# Patient Record
Sex: Male | Born: 1975 | Race: White | Hispanic: No | Smoking: Never smoker
Health system: Southern US, Academic
[De-identification: ages and names within clinical notes are randomized; demographics above are authoritative.]

## PROBLEM LIST (undated history)

## (undated) DIAGNOSIS — I10 Essential (primary) hypertension: Secondary | ICD-10-CM

## (undated) DIAGNOSIS — K222 Esophageal obstruction: Secondary | ICD-10-CM

## (undated) DIAGNOSIS — H52203 Unspecified astigmatism, bilateral: Secondary | ICD-10-CM

## (undated) DIAGNOSIS — K219 Gastro-esophageal reflux disease without esophagitis: Secondary | ICD-10-CM

## (undated) DIAGNOSIS — G43909 Migraine, unspecified, not intractable, without status migrainosus: Secondary | ICD-10-CM

## (undated) DIAGNOSIS — K635 Polyp of colon: Secondary | ICD-10-CM

## (undated) DIAGNOSIS — G4730 Sleep apnea, unspecified: Secondary | ICD-10-CM

## (undated) HISTORY — DX: Gastro-esophageal reflux disease without esophagitis: K21.9

## (undated) HISTORY — DX: Esophageal obstruction: K22.2

## (undated) HISTORY — DX: Essential (primary) hypertension: I10

## (undated) HISTORY — PX: HX GALL BLADDER SURGERY/CHOLE: SHX55

## (undated) HISTORY — DX: Polyp of colon: K63.5

## (undated) HISTORY — DX: Sleep apnea, unspecified: G47.30

## (undated) HISTORY — PX: KNEE SURGERY: SHX244

## (undated) HISTORY — DX: Migraine, unspecified, not intractable, without status migrainosus: G43.909

## (undated) HISTORY — DX: Unspecified astigmatism, bilateral: H52.203

---

## 2018-08-04 LAB — ENTER/EDIT EXTERNAL COMMON LAB RESULTS
CREATININE: 1
POTASSIUM: 3.9

## 2018-08-16 ENCOUNTER — Ambulatory Visit (INDEPENDENT_AMBULATORY_CARE_PROVIDER_SITE_OTHER): Payer: BC Managed Care – PPO | Admitting: Internal Medicine

## 2018-08-16 ENCOUNTER — Other Ambulatory Visit: Payer: Self-pay

## 2018-08-16 ENCOUNTER — Encounter (INDEPENDENT_AMBULATORY_CARE_PROVIDER_SITE_OTHER): Payer: Self-pay | Admitting: Internal Medicine

## 2018-08-16 VITALS — BP 132/86 | HR 82 | Temp 98.0°F | Ht 74.0 in | Wt 366.4 lb

## 2018-08-16 DIAGNOSIS — K219 Gastro-esophageal reflux disease without esophagitis: Secondary | ICD-10-CM

## 2018-08-16 DIAGNOSIS — L989 Disorder of the skin and subcutaneous tissue, unspecified: Principal | ICD-10-CM

## 2018-08-16 DIAGNOSIS — I10 Essential (primary) hypertension: Secondary | ICD-10-CM

## 2018-08-16 DIAGNOSIS — K760 Fatty (change of) liver, not elsewhere classified: Secondary | ICD-10-CM | POA: Insufficient documentation

## 2018-08-16 NOTE — Progress Notes (Signed)
INTERNAL MED, [facility] OUTPATIENT [address]  [place] [address]  [phone]  Name: Seth Valenzuela  MRN: [id number]  Date of Birth: 1975/12/22  Age: 43 y.o.  Date: 08/16/2018    Reason for Visit: Establish Care    History of Present Illness  Patient here today for establishment of care.  Also for evaluation and treatment of hypertension and reflux.  Patient normally resides in a state of [place] but he works here for the [employer].  Is he to the time during the week.  History of hypertension for which he is on Benicar 20 mg daily.  As recent as 2 weeks ago his blood pressure is running a bit high but recently they have been doing better.  Remains on Protonix 40 mg daily with good control of acid reflux.  He does have a few skin lesions which are developing across his forehead for which he like to see Dermatology.  Patient also told that he has a fatty liver in the past.    Patient History  Past Medical History:   Diagnosis Date   . Esophageal reflux    . Hypertension    . Migraine          Past Surgical History:   Procedure Laterality Date   . HX CHOLECYSTECTOMY     . KNEE SURGERY      Right knee         Current Outpatient Medications   Medication Sig   . aspirin (ECOTRIN) 81 mg Oral Tablet, Delayed Release (E.C.) Take 81 mg by mouth Once a day   . olmesartan (BENICAR) 20 mg Oral Tablet Take 20 mg by mouth Once a day    . pantoprazole (PROTONIX) 40 mg Oral Tablet, Delayed Release (E.C.) TAKE 1 TABLET BY MOUTH EVERY DAY     Allergies   Allergen Reactions   . Codeine      Family Medical History:     Problem Relation (Age of Onset)    Alzheimer's/Dementia Paternal Grandmother    Anesthesia Complications Paternal Aunt    Asthma Maternal Grandfather    Blood Clots Mother, Maternal Uncle, Maternal Grandfather    Cancer Mother, Maternal Grandfather    Congestive Heart Failure Father, Paternal Grandfather    Diabetes Father, Paternal Grandfather    Heart Attack Maternal Grandfather, Paternal  Grandfather    High Cholesterol Maternal Grandmother    Hypertension (High Blood Pressure) Father, Paternal Grandfather    Migraines Mother    Parkinsons Disease Maternal Uncle    Sleep disorders Maternal Aunt    Stroke Father, Paternal Grandmother    Sudden Death no cause Maternal Grandfather            Social History     Socioeconomic History   . Marital status: Divorced     Spouse name: Not on file   . Number of children: Not on file   . Years of education: Not on file   . Highest education level: Not on file   Occupational History   . Not on file   Social Needs   . Financial resource strain: Not on file   . Food insecurity     Worry: Not on file     Inability: Not on file   . Transportation needs     Medical: Not on file     Non-medical: Not on file   Tobacco Use   . Smoking status: Never Smoker   . Smokeless tobacco: Never  Used   Substance and Sexual Activity   . Alcohol use: Never     Frequency: Never   . Drug use: Never   . Sexual activity: Not on file   Lifestyle   . Physical activity     Days per week: Not on file     Minutes per session: Not on file   . Stress: Not on file   Relationships   . Social Product manager on phone: Not on file     Gets together: Not on file     Attends religious service: Not on file     Active member of club or organization: Not on file     Attends meetings of clubs or organizations: Not on file     Relationship status: Not on file   . Intimate partner violence     Fear of current or ex partner: Not on file     Emotionally abused: Not on file     Physically abused: Not on file     Forced sexual activity: Not on file   Other Topics Concern   . Not on file   Social History Narrative   . Not on file       Review of Systems  BP 132/86 (Site: Left, Patient Position: Sitting, Cuff Size: Adult Large)   Pulse 82   Temp 36.7 C (98 F)   Ht 1.88 m (6\' 2" )   Wt (!) 166 kg (366 lb 6.5 oz)   SpO2 95%   BMI 47.04 kg/m       Constitutional: No fevers, chills, malaise or abnormal  weight loss.    HENT: No tinnitus, vertigo, hearing loss, ear pain, sinus drainage, nasal congestion or throat pain.  Eyes: No vision changes, diplopia, drainage, pain  Respiratory: No cough, shortness of breath, wheezing  Cardiovascular: No chest pain, PND, orthopnea, palpitations, lower extremity edema.    Gastrointestinal: No abdominal pain, nausea, vomiting, diarrhea, constipation, melana or hematochezia   Endocrine: No polyuria, polydipsia, heat or cold intolerance.  Genitourinary: No dysuria, urgency, frequency, hematuria, nocturia.  Musculoskeletal: No myalgias, arthralgias.  Skin: No rashes, suspicious lesions. + forehead lesions  Neurological: No headaches, weakness, paresthesias  Hematological:  No bleeding, spontaneous bruising.  Psychiatric/Behavioral: No confusion, agitation, hallucinations, paranoia, delusions.    All other systems reviewed and are negative.    Physical Examination:  Vitals reviewed.  Constitutional: He is oriented to person, place, and time.  He appears well-developed and well-nourished.   HEENT:  Normocephalic, atraumatic.  TM's clear and intact bilaterally.  Canals clear.  Nose clear with drainage.  Normal appearing nasal mucosa.  Posterior pharynx clear without lesions or exudate. Mucous membranes moist without lesions.   Neck: Supple. Thyroid normal without enlargement or palpable nodules.  Trachea midline.  Cardiovascular: Normal rate and rhythm without murmurs, rubs or gallops.  No JVD.  No bruits.  Pulmonary/Chest: Lungs clear without wheezes, rales or rhonchi.  No chest wall tenderness or deformity.   Abdominal: Soft.  Obese, nontender, nondistended with normal bowel sounds in all quadrants.  No guarding, rebound , abnormal masses.  Rectal exam deferred.  Musculoskeletal: Full and painless ROM in all joint groups.  Neurological: Alert and oriented x 4.  Cranial nerves 2-12 intact without defecits.  DTR's 2+ BUE's/BLE's.  Strength and sensation intact in all extremeties.      Skin: Skin is warm and dry. No rash or suspicious skin lesions noted.  Several white papular lesions present  across his forehead.  Psychiatric:  Normal mood and affect. Speech is normal and behavior is normal. Judgment and thought content normal. Cognition and memory are normal.       Assessment and Plan    (L98.9) Skin lesions  (primary encounter diagnosis)  Plan: Refer to [facility]        Referral made to [facility] dermatology for evaluation.    (I10) Essential hypertension  Plan:  Blood pressure well controlled today.  Continue Benicar 20 mg daily.  Continue to monitor at home periodically.  Follow-up of his return in 6 months.    (K21.9) Gastroesophageal reflux disease without esophagitis  Plan:  Symptoms well controlled on Protonix 40 mg daily.  Continue along with dietary modification.    (K76.0) Fatty liver  Plan:  Continue to control risk factors.  Abstain from alcohol.  Continue advanced diet in hopes of promoting further weight loss.  Patient reports that he has lost 30-40 lb thus far.        Orders Placed This Encounter   . Refer to [facility]         Return in about 6 months (around 02/15/2019).      The patient has been educated and verbalized understanding regarding the services provided during this visit.    Donna Bernard, MD

## 2018-08-20 ENCOUNTER — Other Ambulatory Visit (INDEPENDENT_AMBULATORY_CARE_PROVIDER_SITE_OTHER): Payer: Self-pay | Admitting: Internal Medicine

## 2018-09-05 ENCOUNTER — Ambulatory Visit
Payer: BC Managed Care – PPO | Attending: DERMATOLOGY | Admitting: Student in an Organized Health Care Education/Training Program

## 2018-09-05 ENCOUNTER — Other Ambulatory Visit: Payer: Self-pay

## 2018-09-05 VITALS — Temp 97.3°F | Ht 74.0 in | Wt 358.0 lb

## 2018-09-05 DIAGNOSIS — L738 Other specified follicular disorders: Secondary | ICD-10-CM | POA: Insufficient documentation

## 2018-09-05 DIAGNOSIS — L700 Acne vulgaris: Secondary | ICD-10-CM | POA: Insufficient documentation

## 2018-09-05 MED ORDER — TRETINOIN 0.025 % TOPICAL CREAM
TOPICAL_CREAM | Freq: Every evening | CUTANEOUS | 5 refills | Status: AC
Start: 2018-09-05 — End: ?

## 2018-09-05 NOTE — Progress Notes (Addendum)
Dermatology, [facility]  [place] [address]  [phone]    Date:   09/05/2018  Name: Seth Valenzuela  Age: 43 y.o.    Chief complaint: Skin Check    HPI  Seth Valenzuela is a 43 y.o. male presenting for skin check.  Patient was seen by outside dermatologist ~4 years ago who epilated small yellowish lesions on his face. They are do not hurt, itch, bleed, or ulcerate but they are very bothersome to patient and he would ike to removed.  Additionally, he is curious about treatment options for the small black lesions on his nose.  He otherwise denies any new, changing, bleeding, or rapidly growing lesions.  No other skin-related complaints.  No personal history of skin cancer.   Family history of precancerous lesions in grandfather.     Review of Systems   Constitutional: Negative for chills and fever.   Skin: Negative for itching and rash.     Current Medications  . aspirin (ECOTRIN) 81 mg Oral Tablet, Delayed Release (E.C.) Take 81 mg by mouth Once a day   . olmesartan (BENICAR) 20 mg Oral Tablet Take 20 mg by mouth Once a day    . pantoprazole (PROTONIX) 40 mg Oral Tablet, Delayed Release (E.C.) TAKE 1 TABLET BY MOUTH EVERY DAY     Allergies   Allergen Reactions   . Codeine      Past Medical History:   Diagnosis Date   . Esophageal reflux    . Hypertension    . Migraine      Physical Exam  Vitals: Temperature 36.3 C (97.3 F), temperature source Thermal Scan, height 1.88 m (6\' 2" ), weight (!) 162 kg (358 lb 0.4 oz).  Physical Exam  Constitutional:       Appearance: Normal appearance.   HENT:      Head:     Skin:     General: Skin is warm and dry.   Neurological:      Mental Status: He is alert and oriented to person, place, and time.     General skin exam was performed including head, neck, anterior/posterior trunk, bilateral upper, and lower extremities and revealed no areas of concern other than those documented.     Assessment and Plan  Problem List Items  Addressed This Visit     None      Visit Diagnoses     Skin lesions            1. 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 Epilated with Epilating needle. No bleeding or complications. Patient tolerated well. Advised to return to clinic with any future problems.   - Patient charged $75 for cosmetic procedure. No other bill for this service should be generated.     2. Acne vulgaris, comedonal (#2 on head) -  - Start tretinoin 0.025% cream nightly, prescription given.  Reviewed risk of dryness, irritation.      The patient was educated on the importance of avoiding excessive sun exposure and wearing sunscreen daily.  Advised patient to re-apply sunscreen every 2-3 hours.  Advised the patient to avoid going to the tanning beds.  Advised to check skin routinely for any changes, especially any new moles or changes in existing moles.    Follow up as needed     I am scribing for, and in the presence of, Dr. Arnoldo Morale for services provided on 09/05/2018.  [employer], SCRIBE  I personally performed the services described in this documentation, as scribed  in my presence, and it is both accurate  and complete.    Rosetta Posner, MD  09/05/2018, 13:57  See resident's note for details. I saw and examined the patient and agree with the resident's findings and plan as written except as noted, and I was present and supervised/observed all procedures in their entirety.    Mauri Reading, MD 09/05/2018, 14:32

## 2018-12-12 ENCOUNTER — Inpatient Hospital Stay (HOSPITAL_COMMUNITY)
Admission: EM | Admit: 2018-12-12 | Discharge: 2018-12-12 | Disposition: A | Discharge status: Home / Self Care | Payer: BC Managed Care – PPO | Source: Other Acute Inpatient Hospital

## 2018-12-12 ENCOUNTER — Observation Stay (HOSPITAL_COMMUNITY): Payer: Self-pay | Admitting: Internal Medicine

## 2018-12-12 DIAGNOSIS — I249 Acute ischemic heart disease, unspecified: Secondary | ICD-10-CM

## 2018-12-12 DIAGNOSIS — R0789 Other chest pain: Secondary | ICD-10-CM

## 2018-12-12 DIAGNOSIS — I10 Essential (primary) hypertension: Secondary | ICD-10-CM

## 2018-12-12 LAB — ENTER/EDIT EXTERNAL COMMON LAB RESULTS
CHOLESTEROL: 193
CREATININE: 0.67
HDL-CHOLESTEROL: 38
HEMOGLOBIN A1C: 6.5
LDL (CALCULATED): 113
POTASSIUM: 4.1
TRIGLYCERIDES: 208

## 2018-12-13 ENCOUNTER — Ambulatory Visit (INDEPENDENT_AMBULATORY_CARE_PROVIDER_SITE_OTHER): Payer: BC Managed Care – PPO | Admitting: Internal Medicine

## 2018-12-13 ENCOUNTER — Other Ambulatory Visit: Payer: Self-pay

## 2018-12-13 ENCOUNTER — Encounter (INDEPENDENT_AMBULATORY_CARE_PROVIDER_SITE_OTHER): Payer: Self-pay | Admitting: Internal Medicine

## 2018-12-13 VITALS — BP 136/86 | HR 98 | Ht 74.0 in | Wt 349.9 lb

## 2018-12-13 DIAGNOSIS — K219 Gastro-esophageal reflux disease without esophagitis: Secondary | ICD-10-CM

## 2018-12-13 DIAGNOSIS — K760 Fatty (change of) liver, not elsewhere classified: Secondary | ICD-10-CM

## 2018-12-13 DIAGNOSIS — R0789 Other chest pain: Secondary | ICD-10-CM

## 2018-12-13 DIAGNOSIS — I10 Essential (primary) hypertension: Secondary | ICD-10-CM

## 2018-12-13 DIAGNOSIS — I2510 Atherosclerotic heart disease of native coronary artery without angina pectoris: Secondary | ICD-10-CM

## 2018-12-13 HISTORY — DX: Atherosclerotic heart disease of native coronary artery without angina pectoris: I25.10

## 2018-12-13 NOTE — Progress Notes (Signed)
INTERNAL MED, [facility] OUTPATIENT [address]  [place] [address]  [phone]  Name: Seth Valenzuela  MRN: [id number]  Date of Birth: [birth year]  Age: 43 y.o.  Date: 12/13/2018    Reason for Visit: Hospital Follow Up    History of Present Illness  Patient here today for follow-up of hypertension, GERD, fatty liver.  Patient also for hospital follow-up.  He was hospitalized overnight at [facility] with substernal chest pain radiating into the left arm.  This occurred yesterday on his way to work.  Was having some heaviness in his chest.  Radiated into the left arm.  His blood pressure was also markedly elevated at 185/115.  Patient was given a single dose of sublingual nitroglycerin in his pain went completely away.  Cardiac enzymes were negative.  Had a CT scan performed on his chest which was negative for PE or dissection.  He did not undergo ischemic evaluation secondary to having a cardiac catheterization performed 10/25/2017 which at that time just showed some luminal irregularities.  Patient does have reflux and has been having some breakthrough symptoms.  He does not take Protonix on a regular basis secondary to a causing constipation.  He also reports that he has not been taking his Benicar every day secondary to if precipitating hypotension in the past.  Was told that his A1c was 6.5 today.  Patient does have occasional dysphagia.  Has never had endoscopy.  He somewhat frustrated and this is the 3rd episode of chest pain he has had like this since last August    Patient History  Past Medical History:   Diagnosis Date   . Coronary artery disease involving native coronary artery of native heart 12/13/2018   . Esophageal reflux    . Hypertension    . Migraine          Past Surgical History:   Procedure Laterality Date   . HX CHOLECYSTECTOMY     . KNEE SURGERY      Right knee         Current Outpatient Medications   Medication Sig   . aspirin (ECOTRIN) 81 mg Oral Tablet, Delayed Release  (E.C.) Take 81 mg by mouth Once a day   . olmesartan (BENICAR) 20 mg Oral Tablet Take 20 mg by mouth Once a day    . pantoprazole (PROTONIX) 40 mg Oral Tablet, Delayed Release (E.C.) TAKE 1 TABLET BY MOUTH EVERY DAY   . Tretinoin (RETIN-A) 0.025 % Cream Apply topically Every night     Allergies   Allergen Reactions   . Codeine      Family Medical History:     Problem Relation (Age of Onset)    Alzheimer's/Dementia Paternal Grandmother    Anesthesia Complications Paternal Aunt    Asthma Maternal Grandfather    Blood Clots Mother, Maternal Uncle, Maternal Grandfather    Cancer Mother, Maternal Grandfather    Congestive Heart Failure Father, Paternal Grandfather    Diabetes Father, Paternal Grandfather    Heart Attack Maternal Grandfather, Paternal Grandfather    High Cholesterol Maternal Grandmother    Hypertension (High Blood Pressure) Father, Paternal Grandfather    Migraines Mother    Parkinsons Disease Maternal Uncle    Sleep disorders Maternal Aunt    Stroke Father, Paternal Grandmother    Sudden Death no cause Maternal Grandfather            Social History     Socioeconomic History   . Marital status: Divorced  Spouse name: Not on file   . Number of children: Not on file   . Years of education: Not on file   . Highest education level: Not on file   Occupational History   . Not on file   Social Needs   . Financial resource strain: Not on file   . Food insecurity     Worry: Not on file     Inability: Not on file   . Transportation needs     Medical: Not on file     Non-medical: Not on file   Tobacco Use   . Smoking status: Never Smoker   . Smokeless tobacco: Never Used   Substance and Sexual Activity   . Alcohol use: Never     Frequency: Never   . Drug use: Never   . Sexual activity: Not on file   Lifestyle   . Physical activity     Days per week: Not on file     Minutes per session: Not on file   . Stress: Not on file   Relationships   . Social Product manager on phone: Not on file     Gets together: Not  on file     Attends religious service: Not on file     Active member of club or organization: Not on file     Attends meetings of clubs or organizations: Not on file     Relationship status: Not on file   . Intimate partner violence     Fear of current or ex partner: Not on file     Emotionally abused: Not on file     Physically abused: Not on file     Forced sexual activity: Not on file   Other Topics Concern   . Not on file   Social History Narrative   . Not on file       Review of Systems  BP 136/86   Pulse 98   Ht 1.88 m (6\' 2" )   Wt (!) 159 kg (349 lb 13.9 oz)   SpO2 97%   BMI 44.92 kg/m       Constitutional: No fevers, chills, malaise or abnormal weight loss.    HENT: No tinnitus, vertigo, hearing loss, ear pain, sinus drainage, nasal congestion or throat pain.  Eyes: No vision changes, diplopia, drainage, pain  Respiratory: No cough, shortness of breath, wheezing  Cardiovascular: + chest pain, no PND, orthopnea, palpitations, lower extremity edema.    Gastrointestinal: No abdominal pain, nausea, vomiting, diarrhea, constipation, melana or hematochezia + dyspepsia + dysphagia  Endocrine: No polyuria, polydipsia, heat or cold intolerance.  Genitourinary: No dysuria, urgency, frequency, hematuria, nocturia.  Musculoskeletal: No myalgias, arthralgias.  Skin: No rashes, suspicious lesions.  Neurological: No headaches, weakness, paresthesias  Hematological:  No bleeding, spontaneous bruising.  Psychiatric/Behavioral: No confusion, agitation, hallucinations, paranoia, delusions.    All other systems reviewed and are negative.    Physical Examination:  Vitals reviewed.  Constitutional: He is oriented to person, place, and time.  He appears well-developed and well-nourished.   HEENT:  Normocephalic, atraumatic.  TM's clear and intact bilaterally.  Canals clear.  Nose clear with drainage.  Normal appearing nasal mucosa.  Posterior pharynx clear without lesions or exudate. Mucous membranes moist without lesions.      Neck: Supple. Thyroid normal without enlargement or palpable nodules.  Trachea midline.  Cardiovascular: Normal rate and rhythm without murmurs, rubs or gallops.  No JVD.  No bruits.  Pulmonary/Chest: Lungs clear without wheezes, rales or rhonchi.  No chest wall tenderness or deformity.   Abdominal: Soft. Nontender, nondistended with normal bowel sounds in all quadrants.  No guarding, rebound , abnormal masses.  Rectal exam deferred.  Musculoskeletal: Full and painless ROM in all joint groups.  Neurological: Alert and oriented x 4.  Cranial nerves 2-12 intact without defecits.  DTR's 2+ BUE's/BLE's.  Strength and sensation intact in all extremeties.   Skin: Skin is warm and dry. No rash or suspicious skin lesions noted.  Psychiatric:  Normal mood and affect. Speech is normal and behavior is normal. Judgment and thought content normal. Cognition and memory are normal.       Assessment and Plan    (I10) Essential hypertension  (primary encounter diagnosis)  Plan:  Blood pressure stable today.  Encouraged him to try taking his Benicar on a daily basis once again.  Check his morning blood pressure and recommended he take his med as long as his systolic pressure is [id number] or greater.    (K21.9) Gastroesophageal reflux disease without esophagitis  Plan: ENDOSCOPY REQUEST (RUBY ONLY)        Recommend regular use of Protonix.  Increase fluid intake as well as fruit and fiber to help promote regular bowel movement.  Consider fiber supplement if needed.  Referral was also made for endoscopy secondary to recurrent noncardiac chest pain and intermittent dysphagia.    (K76.0) Fatty liver  Plan:  Continue risk factor reduction.  Follow low-fat/low-cholesterol diet.  Follow liver enzymes.    (R07.89) Other chest pain  Plan: ENDOSCOPY REQUEST (RUBY ONLY)        Extremely unlikely to be cardiac related given his reassuring cardiac catheterization 10/25/2017.  Sign for records from hospital stay yesterday.  Follow with regular use of  Protonix.  Referred for upper endoscopy.    (I25.10) Coronary artery disease involving native coronary artery of native heart without angina pectoris  Plan:  Minimal coronary disease on cardiac catheterization.  Treat risk factors aggressively.  Will discuss statin therapy at his return visit in 1 month.        Orders Placed This Encounter   . ENDOSCOPY REQUEST (RUBY ONLY)       Return in about 1 month (around 01/13/2019).      The patient has been educated and verbalized understanding regarding the services provided during this visit.    Donna Bernard, MD

## 2018-12-27 ENCOUNTER — Other Ambulatory Visit (INDEPENDENT_AMBULATORY_CARE_PROVIDER_SITE_OTHER): Payer: Self-pay | Admitting: Internal Medicine

## 2019-01-11 ENCOUNTER — Other Ambulatory Visit: Payer: Self-pay

## 2019-01-11 ENCOUNTER — Encounter (INDEPENDENT_AMBULATORY_CARE_PROVIDER_SITE_OTHER): Payer: Self-pay | Admitting: Internal Medicine

## 2019-01-11 ENCOUNTER — Ambulatory Visit (INDEPENDENT_AMBULATORY_CARE_PROVIDER_SITE_OTHER): Payer: BC Managed Care – PPO | Admitting: Internal Medicine

## 2019-01-11 VITALS — BP 150/90 | HR 78 | Temp 97.5°F | Ht 74.0 in | Wt 366.6 lb

## 2019-01-11 DIAGNOSIS — I83812 Varicose veins of left lower extremities with pain: Secondary | ICD-10-CM

## 2019-01-11 DIAGNOSIS — Z23 Encounter for immunization: Secondary | ICD-10-CM

## 2019-01-11 DIAGNOSIS — K219 Gastro-esophageal reflux disease without esophagitis: Secondary | ICD-10-CM

## 2019-01-11 DIAGNOSIS — I2510 Atherosclerotic heart disease of native coronary artery without angina pectoris: Secondary | ICD-10-CM

## 2019-01-11 DIAGNOSIS — I10 Essential (primary) hypertension: Secondary | ICD-10-CM

## 2019-01-11 DIAGNOSIS — E7800 Pure hypercholesterolemia, unspecified: Secondary | ICD-10-CM

## 2019-01-11 HISTORY — DX: Pure hypercholesterolemia, unspecified: E78.00

## 2019-01-11 MED ORDER — OLMESARTAN 20 MG TABLET
20.00 mg | ORAL_TABLET | Freq: Two times a day (BID) | ORAL | 3 refills | Status: DC
Start: 2019-01-11 — End: 2019-01-14

## 2019-01-11 NOTE — Nursing Note (Signed)
01/11/19 0800   Depression Screen   Little interest or pleasure in doing things. 0   Feeling down, depressed, or hopeless 0   PHQ 2 Total 0

## 2019-01-11 NOTE — Progress Notes (Signed)
INTERNAL MED, [facility] OUTPATIENT [place]  [place] [address]  [phone]  Name: Seth Valenzuela  MRN: [id number]  Date of Birth: 1975-08-05  Age: 43 y.o.  Date: 01/11/2019    Reason for Visit: Follow-up    History of Present Illness  Patient for one-month follow-up of hypertension, CAD, GERD, hypercholesterolemia, varicose veins lower extremities.  Patient presents today complaining about a 3 day history of pain located in the posterior aspect of his left calf.  Patient has significant varicosities in the left lower extremity when compared to the right.  He does wear knee-high support stockings during the day.  There has been no swelling or redness the left lower extremity.  No chest pain or shortness of breath.  His chest pain is doing better with regular use of Protonix.  Requesting flu vaccine.  He believes his blood pressure is doing worse on amlodipine rather than better.  He remains on Benicar 20 mg once daily along with amlodipine 5 mg daily.  Home blood pressure readings have been in the [id number] range systolic.    Patient History  Past Medical History:   Diagnosis Date   . Coronary artery disease involving native coronary artery of native heart 12/13/2018   . Esophageal reflux    . Hypercholesteremia 01/11/2019   . Hypertension    . Migraine          Past Surgical History:   Procedure Laterality Date   . HX CHOLECYSTECTOMY     . KNEE SURGERY      Right knee         Current Outpatient Medications   Medication Sig   . aspirin (ECOTRIN) 81 mg Oral Tablet, Delayed Release (E.C.) Take 81 mg by mouth Once a day   . olmesartan (BENICAR) 20 mg Oral Tablet Take 1 Tab (20 mg total) by mouth Twice daily   . pantoprazole (PROTONIX) 40 mg Oral Tablet, Delayed Release (E.C.) TAKE 1 TABLET BY MOUTH EVERY DAY   . Tretinoin (RETIN-A) 0.025 % Cream Apply topically Every night     Allergies   Allergen Reactions   . Codeine      Family Medical History:     Problem Relation (Age of Onset)     Alzheimer's/Dementia Paternal Grandmother    Anesthesia Complications Paternal Aunt    Asthma Maternal Grandfather    Blood Clots Mother, Maternal Uncle, Maternal Grandfather    Cancer Mother, Maternal Grandfather    Congestive Heart Failure Father, Paternal Grandfather    Diabetes Father, Paternal Grandfather    Heart Attack Maternal Grandfather, Paternal Grandfather    High Cholesterol Maternal Grandmother    Hypertension (High Blood Pressure) Father, Paternal Grandfather    Migraines Mother    Parkinsons Disease Maternal Uncle    Sleep disorders Maternal Aunt    Stroke Father, Paternal Grandmother    Sudden Death no cause Maternal Grandfather            Social History     Socioeconomic History   . Marital status: Divorced     Spouse name: Not on file   . Number of children: Not on file   . Years of education: Not on file   . Highest education level: Not on file   Occupational History   . Not on file   Social Needs   . Financial resource strain: Not on file   . Food insecurity     Worry: Not on file     Inability:  Not on file   . Transportation needs     Medical: Not on file     Non-medical: Not on file   Tobacco Use   . Smoking status: Never Smoker   . Smokeless tobacco: Never Used   Substance and Sexual Activity   . Alcohol use: Never     Frequency: Never   . Drug use: Never   . Sexual activity: Not on file   Lifestyle   . Physical activity     Days per week: Not on file     Minutes per session: Not on file   . Stress: Not on file   Relationships   . Social Product manager on phone: Not on file     Gets together: Not on file     Attends religious service: Not on file     Active member of club or organization: Not on file     Attends meetings of clubs or organizations: Not on file     Relationship status: Not on file   . Intimate partner violence     Fear of current or ex partner: Not on file     Emotionally abused: Not on file     Physically abused: Not on file     Forced sexual activity: Not on file      Other Topics Concern   . Not on file   Social History Narrative   . Not on file       Review of Systems  BP (!) 150/90 (Site: Left, Patient Position: Sitting, Cuff Size: Adult Large)   Pulse 78   Temp 36.4 C (97.5 F)   Ht 1.88 m (6\' 2" )   Wt (!) 166 kg (366 lb 10 oz)   SpO2 99%   BMI 47.07 kg/m       Constitutional: No fevers, chills, malaise or abnormal weight loss.    HENT: No tinnitus, vertigo, hearing loss, ear pain, sinus drainage, nasal congestion or throat pain.  Eyes: No vision changes, diplopia, drainage, pain  Respiratory: No cough, shortness of breath, wheezing  Cardiovascular: No chest pain, PND, orthopnea, palpitations, lower extremity edema.    Gastrointestinal: No abdominal pain, nausea, vomiting, diarrhea, constipation, melana or hematochezia   Endocrine: No polyuria, polydipsia, heat or cold intolerance.  Genitourinary: No dysuria, urgency, frequency, hematuria, nocturia.  Musculoskeletal: + myalgias, no arthralgias.  Skin: No rashes, suspicious lesions.  Neurological: No headaches, weakness, paresthesias  Hematological:  No bleeding, spontaneous bruising.  Psychiatric/Behavioral: No confusion, agitation, hallucinations, paranoia, delusions.    All other systems reviewed and are negative.    Physical Examination:  Vitals reviewed.  Constitutional: He is oriented to person, place, and time.  He appears well-developed and well-nourished.   HEENT:  Normocephalic, atraumatic.  TM's clear and intact bilaterally.  Canals clear.  Nose clear with drainage.  Normal appearing nasal mucosa.  Posterior pharynx clear without lesions or exudate. Mucous membranes moist without lesions.   Neck: Supple. Thyroid normal without enlargement or palpable nodules.  Trachea midline.  Cardiovascular: Normal rate and rhythm without murmurs, rubs or gallops.  No JVD.  No bruits.  Significant ropey varicosities present over the left lower extremity.  Mild tenderness over the posterior calf with slight firmness of a  superficial varicosity.  No redness or warmth.  Pulmonary/Chest: Lungs clear without wheezes, rales or rhonchi.  No chest wall tenderness or deformity.   Abdominal: Soft. Nontender, nondistended with normal bowel sounds in all quadrants.  No guarding, rebound , abnormal masses.  Rectal exam deferred.  Musculoskeletal: Full and painless ROM in all joint groups.  Neurological: Alert and oriented x 4.  Cranial nerves 2-12 intact without defecits.  DTR's 2+ BUE's/BLE's.  Strength and sensation intact in all extremeties.   Skin: Skin is warm and dry. No rash or suspicious skin lesions noted.  Psychiatric:  Normal mood and affect. Speech is normal and behavior is normal. Judgment and thought content normal. Cognition and memory are normal.       Assessment and Plan    (I10) Essential hypertension  (primary encounter diagnosis)  Plan:  Systolic pressure remains elevated.  Discontinue amlodipine.  Increase Benicar to 20 mg b.i.d..  Continue to monitor home blood pressures.    [id number]) Varicose veins of left lower extremity with pain  Plan:  Amlodipine discontinued secondary to edema and significant venous insufficiency.  Continue knee-high support stockings.  Recommend moist heat to left posterior calf twice a day.    (I25.10) Coronary artery disease involving native coronary artery of native heart without angina pectoris  Plan:  Stable and asymptomatic.  Continue aspirin 81 mg daily.    (K21.9) Gastroesophageal reflux disease without esophagitis  Plan:  Symptoms improved with regular use of Protonix.  Continue 40 mg every other day.  Continue dietary modification.    (E78.00) Hypercholesteremia  Plan:  Last lipid profile showed an LDL of 113. Goal should be less than 100 given his mild coronary disease.  Recommend statin therapy.  He would like to hold off and recheck lipids in 6 months.    (Z23) Need for influenza vaccination  Plan: Influenza Vaccine IM Age 37mo through Adult         0.5ML(admin)        Flu vaccine  today.        Orders Placed This Encounter   . Influenza Vaccine IM Age 51mo through Adult 0.5ML(admin)   . olmesartan (BENICAR) 20 mg Oral Tablet                 Return in about 3 months (around 04/13/2019).      The patient has been educated and verbalized understanding regarding the services provided during this visit.    Donna Bernard, MD

## 2019-01-14 ENCOUNTER — Telehealth (INDEPENDENT_AMBULATORY_CARE_PROVIDER_SITE_OTHER): Payer: Self-pay | Admitting: Internal Medicine

## 2019-01-14 ENCOUNTER — Other Ambulatory Visit (INDEPENDENT_AMBULATORY_CARE_PROVIDER_SITE_OTHER): Payer: Self-pay | Admitting: Internal Medicine

## 2019-01-14 MED ORDER — OLMESARTAN 40 MG TABLET
40.0000 mg | ORAL_TABLET | Freq: Every day | ORAL | 3 refills | Status: AC
Start: 2019-01-14 — End: ?

## 2019-01-14 NOTE — Telephone Encounter (Signed)
Donna Bernard, MD  Kamille Toomey, Exie Parody, [facility]    Caller: Unspecified (Today, 1:20 PM)              Please let the patient know that insurance will not cover 20 mg twice daily on the Benicar. I did send in a 40 mg tablet. If he is more comfortable, he can cut that in half and take 1/2 pill twice daily rather than all once.      Called left message for return call.     [place], [facility]  01/14/2019, 14:04

## 2019-01-14 NOTE — Telephone Encounter (Signed)
Olmesartan Medoxomil 20 mg take 1 talbet twice daily will need prior auth. Can they use 40 mg once daily it wont need a prior auth. A new script will be needed to be sent.     Paul Smiths, [facility]  01/14/2019, 13:23

## 2019-01-14 NOTE — Telephone Encounter (Signed)
Spoke with patient he had no further questions.     Exie Parody Malissia Rabbani, [facility]  01/14/2019, 14:15

## 2019-02-12 ENCOUNTER — Encounter (INDEPENDENT_AMBULATORY_CARE_PROVIDER_SITE_OTHER): Payer: BC Managed Care – PPO | Admitting: Internal Medicine

## 2019-02-28 ENCOUNTER — Encounter (HOSPITAL_COMMUNITY): Admission: RE | Payer: Self-pay | Source: Ambulatory Visit

## 2019-02-28 ENCOUNTER — Inpatient Hospital Stay (HOSPITAL_COMMUNITY)
Admission: RE | Admit: 2019-02-28 | Payer: BC Managed Care – PPO | Source: Ambulatory Visit | Admitting: Gastroenterology

## 2019-02-28 SURGERY — GASTROSCOPY
Anesthesia: Monitor Anesthesia Care | Site: Mouth

## 2019-04-01 ENCOUNTER — Ambulatory Visit (INDEPENDENT_AMBULATORY_CARE_PROVIDER_SITE_OTHER): Payer: BC Managed Care – PPO | Admitting: Internal Medicine

## 2019-04-01 ENCOUNTER — Other Ambulatory Visit: Payer: Self-pay

## 2019-04-01 ENCOUNTER — Encounter (INDEPENDENT_AMBULATORY_CARE_PROVIDER_SITE_OTHER): Payer: Self-pay | Admitting: Internal Medicine

## 2019-04-01 VITALS — BP 170/90 | HR 105 | Temp 97.4°F | Ht 74.0 in | Wt 368.8 lb

## 2019-04-01 DIAGNOSIS — N451 Epididymitis: Secondary | ICD-10-CM

## 2019-04-01 MED ORDER — DOXYCYCLINE HYCLATE 100 MG CAPSULE
100.00 mg | ORAL_CAPSULE | Freq: Two times a day (BID) | ORAL | 0 refills | Status: AC
Start: 2019-04-01 — End: 2019-04-11

## 2019-04-01 NOTE — Progress Notes (Signed)
INTERNAL MED, [facility] OUTPATIENT [place]  [place] [address]  [phone]  Name: Seth Valenzuela  MRN: [id number]  Date of Birth: 03-07-1976  Age: 44 y.o.  Date: 04/01/2019    Reason for Visit: Groin Pain    History of Present Illness  Patient presents today with a 2 week history of pain in his right testicle.  No history of trauma or injury.  Denies any fevers or chills.  Has not noticed a lump or swelling in his groin or testicle.  He did do some heavy lifting just prior to the onset of pain.  Denies urinary symptoms.    Patient History  Past Medical History:   Diagnosis Date   . Coronary artery disease involving native coronary artery of native heart 12/13/2018   . Esophageal reflux    . Hypercholesteremia 01/11/2019   . Hypertension    . Migraine          Past Surgical History:   Procedure Laterality Date   . HX CHOLECYSTECTOMY     . KNEE SURGERY      Right knee         Current Outpatient Medications   Medication Sig   . aspirin (ECOTRIN) 81 mg Oral Tablet, Delayed Release (E.C.) Take 81 mg by mouth Once a day   . doxycycline hyclate (VIBRAMYCIN) 100 mg Oral Capsule Take 1 Cap (100 mg total) by mouth Twice daily for 10 days   . olmesartan (BENICAR) 40 mg Oral Tablet Take 1 Tab (40 mg total) by mouth Once a day   . pantoprazole (PROTONIX) 40 mg Oral Tablet, Delayed Release (E.C.) TAKE 1 TABLET BY MOUTH EVERY DAY   . Tretinoin (RETIN-A) 0.025 % Cream Apply topically Every night     Allergies   Allergen Reactions   . Codeine      Family Medical History:     Problem Relation (Age of Onset)    Alzheimer's/Dementia Paternal Grandmother    Anesthesia Complications Paternal Aunt    Asthma Maternal Grandfather    Blood Clots Mother, Maternal Uncle, Maternal Grandfather    Cancer Mother, Maternal Grandfather    Congestive Heart Failure Father, Paternal Grandfather    Diabetes Father, Paternal Grandfather    Heart Attack Maternal Grandfather, Paternal Grandfather    High Cholesterol Maternal  Grandmother    Hypertension (High Blood Pressure) Father, Paternal Grandfather    Migraines Mother    Parkinsons Disease Maternal Uncle    Sleep disorders Maternal Aunt    Stroke Father, Paternal Grandmother    Sudden Death no cause Maternal Grandfather            Social History     Socioeconomic History   . Marital status: Divorced     Spouse name: Not on file   . Number of children: Not on file   . Years of education: Not on file   . Highest education level: Not on file   Occupational History   . Not on file   Social Needs   . Financial resource strain: Not on file   . Food insecurity     Worry: Not on file     Inability: Not on file   . Transportation needs     Medical: Not on file     Non-medical: Not on file   Tobacco Use   . Smoking status: Never Smoker   . Smokeless tobacco: Never Used   Substance and Sexual Activity   . Alcohol use:  Never     Frequency: Never   . Drug use: Never   . Sexual activity: Not on file   Lifestyle   . Physical activity     Days per week: Not on file     Minutes per session: Not on file   . Stress: Not on file   Relationships   . Social Product manager on phone: Not on file     Gets together: Not on file     Attends religious service: Not on file     Active member of club or organization: Not on file     Attends meetings of clubs or organizations: Not on file     Relationship status: Not on file   . Intimate partner violence     Fear of current or ex partner: Not on file     Emotionally abused: Not on file     Physically abused: Not on file     Forced sexual activity: Not on file   Other Topics Concern   . Not on file   Social History Narrative   . Not on file       Review of Systems  BP (!) 170/90 (Site: Left, Patient Position: Sitting, Cuff Size: Adult Large)   Pulse (!) 105   Temp 36.3 C (97.4 F)   Ht 1.88 m (6\' 2" )   Wt (!) 167 kg (368 lb 13.3 oz)   SpO2 97%   BMI 47.35 kg/m       Constitutional: No fevers, chills, malaise or abnormal weight loss.    HENT: No  tinnitus, vertigo, hearing loss, ear pain, sinus drainage, nasal congestion or throat pain.  Eyes: No vision changes, diplopia, drainage, pain  Respiratory: No cough, shortness of breath, wheezing  Cardiovascular: No chest pain, PND, orthopnea, palpitations, lower extremity edema.    Gastrointestinal: No abdominal pain, nausea, vomiting, diarrhea, constipation, melana or hematochezia   Endocrine: No polyuria, polydipsia, heat or cold intolerance.  Genitourinary: No dysuria, urgency, frequency, hematuria, nocturia.+ testicular pain  Musculoskeletal: No myalgias, arthralgias.  Skin: No rashes, suspicious lesions.  Neurological: No headaches, weakness, paresthesias  Hematological:  No bleeding, spontaneous bruising.  Psychiatric/Behavioral: No confusion, agitation, hallucinations, paranoia, delusions.    All other systems reviewed and are negative.    Physical Examination:  Vitals reviewed.  Recheck of blood pressure 140/85.  Constitutional: He is oriented to person, place, and time.  He appears well-developed and well-nourished.   HEENT:  Normocephalic, atraumatic.  TM's clear and intact bilaterally.  Canals clear.  Nose clear with drainage.  Normal appearing nasal mucosa.  Posterior pharynx clear without lesions or exudate. Mucous membranes moist without lesions.   Neck: Supple. Thyroid normal without enlargement or palpable nodules.  Trachea midline.  Cardiovascular: Normal rate and rhythm without murmurs, rubs or gallops.  No JVD.  No bruits.  Pulmonary/Chest: Lungs clear without wheezes, rales or rhonchi.  No chest wall tenderness or deformity.   Abdominal: Soft. Nontender, nondistended with normal bowel sounds in all quadrants.  No guarding, rebound , abnormal masses.  Rectal exam deferred.  GU:  Normal circumcised male.  Testicles are smooth small without palpable masses.  Mild tenderness present over the right epididymis.  No evidence of direct or indirect inguinal hernia present.  Musculoskeletal: Full and  painless ROM in all joint groups.  Neurological: Alert and oriented x 4.  Cranial nerves 2-12 intact without defecits.  DTR's 2+ BUE's/BLE's.  Strength and sensation intact in  all extremeties.   Skin: Skin is warm and dry. No rash or suspicious skin lesions noted.  Psychiatric:  Normal mood and affect. Speech is normal and behavior is normal. Judgment and thought content normal. Cognition and memory are normal.       Assessment and Plan    (N45.1) Epididymitis, right  (primary encounter diagnosis)  Plan:  Script given for doxycycline 100 mg b.i.d. times 10 days.  Notify me if symptoms fail to resolve.  Otherwise will follow-up at his regularly scheduled return visit in approximately 1 month.        Orders Placed This Encounter   . doxycycline hyclate (VIBRAMYCIN) 100 mg Oral Capsule       No follow-ups on file.      The patient has been educated and verbalized understanding regarding the services provided during this visit.    Donna Bernard, MD

## 2019-04-23 ENCOUNTER — Ambulatory Visit (INDEPENDENT_AMBULATORY_CARE_PROVIDER_SITE_OTHER): Payer: BC Managed Care – PPO | Admitting: Internal Medicine

## 2019-04-23 ENCOUNTER — Encounter (INDEPENDENT_AMBULATORY_CARE_PROVIDER_SITE_OTHER): Payer: Self-pay | Admitting: Internal Medicine

## 2019-04-23 ENCOUNTER — Other Ambulatory Visit: Payer: Self-pay

## 2019-04-23 VITALS — BP 140/80 | HR 77 | Temp 97.8°F | Ht 74.0 in | Wt 372.6 lb

## 2019-04-23 DIAGNOSIS — R0789 Other chest pain: Secondary | ICD-10-CM

## 2019-04-23 DIAGNOSIS — I10 Essential (primary) hypertension: Secondary | ICD-10-CM

## 2019-04-23 DIAGNOSIS — K219 Gastro-esophageal reflux disease without esophagitis: Secondary | ICD-10-CM

## 2019-04-23 DIAGNOSIS — G4733 Obstructive sleep apnea (adult) (pediatric): Secondary | ICD-10-CM

## 2019-04-23 MED ORDER — NEBIVOLOL 5 MG TABLET
2.5000 mg | ORAL_TABLET | Freq: Every day | ORAL | 5 refills | Status: AC
Start: 2019-04-23 — End: ?

## 2019-04-23 NOTE — Progress Notes (Signed)
INTERNAL MED, [facility] OUTPATIENT [address]  [place] [address]  [phone]  Name: Seth Valenzuela  MRN: [id number]  Date of Birth: Jan 22, 1976  Age: 44 y.o.  Date: 04/23/2019    Reason for Visit: Chest Pain     History of Present Illness  Patient here today for follow-up from a hospital visit 1 week ago.  Patient was seen at [facility] 1 week ago tomorrow complaining of elevated blood pressure and sharp left-sided chest pain.  He was given nitroglycerin in the emergency department with improvement in blood pressure.  Serial troponins were negative.  EKGs were unchanged.  It was recommended that the patient be admitted and have a stress test performed however he declined.  He contacted the nurse practitioner from his home town regarding his blood pressure issues and his heart rate being up.  She started him on Bystolic 2.5 mg daily.  Has resulted in improvement in his blood pressure and heart rate.  He has had several evaluations in the past for chest pain.  Had a falsely positive nuclear stress test in August 2019 which was followed up with a cardiac catheterization which as per outside reports just showed luminal irregularities.  Patient also reportedly had a sleep study performed which showed fairly significant sleep apnea.  He was never contacted to go back to have a CPAP trial.  His physician from his hometown is currently working on that.  Patient does have acid reflux which is controlled at this time on every other day Protonix.  Daily Protonix results and constipation.    Patient History  Past Medical History:   Diagnosis Date   . Coronary artery disease involving native coronary artery of native heart 12/13/2018   . Esophageal reflux    . Hypercholesteremia 01/11/2019   . Hypertension    . Migraine          Past Surgical History:   Procedure Laterality Date   . HX CHOLECYSTECTOMY     . KNEE SURGERY      Right knee         Current Outpatient Medications   Medication  Sig   . amLODIPine (NORVASC) 5 mg Oral Tablet Take 5 mg by mouth Once a day   . aspirin (ECOTRIN) 81 mg Oral Tablet, Delayed Release (E.C.) Take 81 mg by mouth Once a day   . nebivoloL (BYSTOLIC) 5 mg Oral Tablet Take 0.5 Tablets (2.5 mg total) by mouth Once a day   . olmesartan (BENICAR) 40 mg Oral Tablet Take 1 Tab (40 mg total) by mouth Once a day   . pantoprazole (PROTONIX) 40 mg Oral Tablet, Delayed Release (E.C.) TAKE 1 TABLET BY MOUTH EVERY DAY   . Tretinoin (RETIN-A) 0.025 % Cream Apply topically Every night     Allergies   Allergen Reactions   . Codeine      Family Medical History:     Problem Relation (Age of Onset)    Alzheimer's/Dementia Paternal Grandmother    Anesthesia Complications Paternal Aunt    Asthma Maternal Grandfather    Blood Clots Mother, Maternal Uncle, Maternal Grandfather    Cancer Mother, Maternal Grandfather    Congestive Heart Failure Father, Paternal Grandfather    Diabetes Father, Paternal Grandfather    Heart Attack Maternal Grandfather, Paternal Grandfather    High Cholesterol Maternal Grandmother    Hypertension (High Blood Pressure) Father, Paternal Grandfather    Migraines Mother    Parkinsons Disease Maternal Uncle  Sleep disorders Maternal Aunt    Stroke Father, Paternal Grandmother    Sudden Death no cause Maternal Grandfather            Social History     Socioeconomic History   . Marital status: Divorced     Spouse name: Not on file   . Number of children: Not on file   . Years of education: Not on file   . Highest education level: Not on file   Occupational History   . Not on file   Tobacco Use   . Smoking status: Never Smoker   . Smokeless tobacco: Never Used   Substance and Sexual Activity   . Alcohol use: Never   . Drug use: Never   . Sexual activity: Not on file   Other Topics Concern   . Not on file   Social History Narrative   . Not on file     Social Determinants of Health     Financial Resource Strain:    . Difficulty of Paying Living Expenses: Not on file      Food Insecurity:    . Worried About Charity fundraiser in the Last Year: Not on file   . Ran Out of Food in the Last Year: Not on file   Transportation Needs:    . Lack of Transportation (Medical): Not on file   . Lack of Transportation (Non-Medical): Not on file   Physical Activity:    . Days of Exercise per Week: Not on file   . Minutes of Exercise per Session: Not on file   Stress:    . Feeling of Stress : Not on file   Intimate Partner Violence:    . Fear of Current or Ex-Partner: Not on file   . Emotionally Abused: Not on file   . Physically Abused: Not on file   . Sexually Abused: Not on file       Review of Systems  BP (!) 140/80 (Site: Left, Patient Position: Sitting, Cuff Size: Adult Large)   Pulse 77   Temp 36.6 C (97.8 F)   Ht 1.88 m (6\' 2" )   Wt (!) 169 kg (372 lb 9.2 oz)   SpO2 96%   BMI 47.84 kg/m       Constitutional: No fevers, chills, malaise or abnormal weight loss.    HENT: No tinnitus, vertigo, hearing loss, ear pain, sinus drainage, nasal congestion or throat pain.  Eyes: No vision changes, diplopia, drainage, pain  Respiratory: No cough, shortness of breath, wheezing  Cardiovascular: + chest pain, no PND, orthopnea, palpitations, lower extremity edema.    Gastrointestinal: No abdominal pain, nausea, vomiting, diarrhea, constipation, melana or hematochezia   Endocrine: No polyuria, polydipsia, heat or cold intolerance.  Genitourinary: No dysuria, urgency, frequency, hematuria, nocturia.  Musculoskeletal: No myalgias, arthralgias.  Skin: No rashes, suspicious lesions.  Neurological: No headaches, weakness, paresthesias  Hematological:  No bleeding, spontaneous bruising.  Psychiatric/Behavioral: No confusion, agitation, hallucinations, paranoia, delusions.    All other systems reviewed and are negative.    Physical Examination:  Vitals reviewed.  Constitutional: He is oriented to person, place, and time.  He appears well-developed and well-nourished.   HEENT:  Normocephalic,  atraumatic.  TM's clear and intact bilaterally.  Canals clear.  Nose clear with drainage.  Normal appearing nasal mucosa.  Posterior pharynx clear without lesions or exudate. Mucous membranes moist without lesions.   Neck: Supple. Thyroid normal without enlargement or palpable nodules.  Trachea midline.  Cardiovascular: Normal rate and rhythm without murmurs, rubs or gallops.  No JVD.  No bruits.  Pulmonary/Chest: Lungs clear without wheezes, rales or rhonchi.  No chest wall tenderness or deformity.   Abdominal: Soft.  Obese, nontender, nondistended with normal bowel sounds in all quadrants.  No guarding, rebound , abnormal masses.  Rectal exam deferred.  Musculoskeletal: Full and painless ROM in all joint groups.  Neurological: Alert and oriented x 4.  Cranial nerves 2-12 intact without defecits.  DTR's 2+ BUE's/BLE's.  Strength and sensation intact in all extremeties.   Skin: Skin is warm and dry. No rash or suspicious skin lesions noted.  Psychiatric:  Normal mood and affect. Speech is normal and behavior is normal. Judgment and thought content normal. Cognition and memory are normal.       Assessment and Plan    (K21.9) Gastroesophageal reflux disease without esophagitis  (primary encounter diagnosis)  Plan:  Continue Protonix 40 mg every other day along with dietary modification.  If reflux symptoms become more frequent may need to consider an H2 blocker on the off days that he does not take a PPI.    (G47.33) OSA (obstructive sleep apnea)  Plan:  Emphasized to the patient that he needs to follow up with CPAP trial.  Home town physician is currently working on getting that arranged.    ([place]) Essential hypertension  Plan:  Blood pressure much improved.  Recommend continuation of Bystolic 2.5 mg in the morning.  Continue amlodipine 5 mg and Benicar 40 mg q.h.s..  Continue to monitor blood pressure readings at home.    (R07.89) Other chest pain  Plan:  Do not feel that the patient's chest pain is cardiac in nature  especially given the fact that he had a cardiac catheterization performed 18 months ago which only showed mild luminal irregularities.  Chest pain may be precipitated by bouts of hypertension which will hopefully be improved with addition of Bystolic.  Also be partly due to his reflux is well.  Continue to monitor symptoms on current regimen.        Orders Placed This Encounter   . nebivoloL (BYSTOLIC) 5 mg Oral Tablet                 Return in about 6 weeks (around 06/04/2019).      The patient has been educated and verbalized understanding regarding the services provided during this visit.    Donna Bernard, MD

## 2019-05-08 ENCOUNTER — Encounter (INDEPENDENT_AMBULATORY_CARE_PROVIDER_SITE_OTHER): Payer: BC Managed Care – PPO | Admitting: Internal Medicine

## 2019-10-01 ENCOUNTER — Ambulatory Visit (INDEPENDENT_AMBULATORY_CARE_PROVIDER_SITE_OTHER): Payer: BC Managed Care – PPO | Admitting: Internal Medicine

## 2019-10-01 ENCOUNTER — Other Ambulatory Visit (INDEPENDENT_AMBULATORY_CARE_PROVIDER_SITE_OTHER): Payer: BC Managed Care – PPO

## 2019-10-01 ENCOUNTER — Encounter (FREE_STANDING_LABORATORY_FACILITY)
Admit: 2019-10-01 | Discharge: 2019-10-01 | Disposition: A | Discharge status: Home / Self Care | Payer: BC Managed Care – PPO | Attending: Internal Medicine | Admitting: Internal Medicine

## 2019-10-01 ENCOUNTER — Encounter (INDEPENDENT_AMBULATORY_CARE_PROVIDER_SITE_OTHER): Payer: Self-pay | Admitting: Internal Medicine

## 2019-10-01 ENCOUNTER — Encounter (FREE_STANDING_LABORATORY_FACILITY): Payer: Self-pay | Admitting: Internal Medicine

## 2019-10-01 ENCOUNTER — Other Ambulatory Visit: Payer: Self-pay

## 2019-10-01 VITALS — BP 130/66 | Temp 97.7°F | Ht 74.0 in | Wt 381.0 lb

## 2019-10-01 DIAGNOSIS — K222 Esophageal obstruction: Secondary | ICD-10-CM

## 2019-10-01 DIAGNOSIS — M545 Low back pain, unspecified: Secondary | ICD-10-CM

## 2019-10-01 DIAGNOSIS — R7309 Other abnormal glucose: Secondary | ICD-10-CM

## 2019-10-01 DIAGNOSIS — M62838 Other muscle spasm: Secondary | ICD-10-CM

## 2019-10-01 DIAGNOSIS — I10 Essential (primary) hypertension: Secondary | ICD-10-CM | POA: Insufficient documentation

## 2019-10-01 DIAGNOSIS — K635 Polyp of colon: Secondary | ICD-10-CM | POA: Insufficient documentation

## 2019-10-01 LAB — HGA1C (HEMOGLOBIN A1C WITH EST AVG GLUCOSE)
ESTIMATED AVERAGE GLUCOSE: 174 mg/dL
HEMOGLOBIN A1C: 7.7 % — ABNORMAL HIGH (ref 4.0–5.6)

## 2019-10-01 LAB — BASIC METABOLIC PANEL
ANION GAP: 8 mmol/L (ref 4–13)
BUN/CREA RATIO: 17 (ref 6–22)
BUN: 13 mg/dL (ref 8–25)
CALCIUM: 9.5 mg/dL (ref 8.5–10.0)
CHLORIDE: 99 mmol/L (ref 96–111)
CO2 TOTAL: 24 mmol/L (ref 22–30)
CREATININE: 0.76 mg/dL (ref 0.75–1.35)
ESTIMATED GFR: >90 mL/min/BSA (ref >=60)
GLUCOSE: 262 mg/dL — ABNORMAL HIGH (ref 65–125)
POTASSIUM: 4 mmol/L (ref 3.5–5.1)
SODIUM: 131 mmol/L — ABNORMAL LOW (ref 136–145)

## 2019-10-01 MED ORDER — METHOCARBAMOL 750 MG TABLET
ORAL_TABLET | ORAL | 0 refills | Status: AC
Start: 2019-10-01 — End: 2019-10-11

## 2019-10-01 NOTE — Nursing Note (Signed)
10/01/19 1600   Urine test  (Siemens Multistix 10 SG)   Time collected 1608   Color Yellow   Clarity Clear   Glucose Negative   Bilirubin Negative   Ketones (!) Trace (5 mg/dl)   Urine Specific Gravity 1.030   Blood (urine) Negative   pH 6.0   Protein Negative   Urobilinogen 0.2mg /dL (Normal)   Nitrite Negative   Leukocytes Negative   Performed Status Manual   Bottle Number   (Siemens Multistix 10 SG) 11   Lot # [id number]   Expiration Date 10/11/20   Initials jll

## 2019-10-01 NOTE — Progress Notes (Signed)
OUTPATIENT PROGRESS NOTE    Subjective:   Patient ID:  Seth Valenzuela is a pleasant 44 y.o. male.    Chief Complaint: Lower Back Pain      History of Present Illness:    Patient presents with acute complaints of left-sided low back pain, hypertension    Left-sided low back pain started approximately 3 months ago, he has been going to chiropractic care without significant improvement.  He states that over the weekend he had 1 day of slightly dark urine, he is worried that the back pain is related to kidneys.  He has had no blood in the urine, no foul odor.  No trouble urinating.  Left-sided low back pain is tight in character, moderate severity, improved by nothing.     He does have a history of elevated hemoglobin A1c, he would like to have this recheck it.    Hypertension - chronic for over a year  Compliant with medications  Avoiding excessive salt in diet  No side effects to report  No chest pain, headache, blurred or double vision    To update his medical record, he did have a colonoscopy and EGD, colonoscopy showed a few polyps.  EGD showed esophageal erosions and a Schatzki ring.  He is going to have medical records sent to our office.  This was completed outside of the [facility].    The history is provided by the patient    Denies chest pain, palpitations, shortness of breath, wheezing      Allergies:     Allergies   Allergen Reactions   . Codeine          Medications:   amLODIPine (NORVASC) 5 mg Oral Tablet, Take 5 mg by mouth Once a day  aspirin (ECOTRIN) 81 mg Oral Tablet, Delayed Release (E.C.), Take 81 mg by mouth Once a day  cetirizine (ZYRTEC) 10 mg Oral Tablet, Take 10 mg by mouth Once a day  nebivoloL (BYSTOLIC) 5 mg Oral Tablet, Take 0.5 Tablets (2.5 mg total) by mouth Once a day  olmesartan (BENICAR) 40 mg Oral Tablet, Take 1 Tab (40 mg total) by mouth Once a day  pantoprazole (PROTONIX) 40 mg Oral Tablet, Delayed Release (E.C.), 20 mg Once a day   Tretinoin (RETIN-A) 0.025 % Cream, Apply  topically Every night    No facility-administered medications prior to visit.        Immunization History:     Immunization History   Administered Date(s) Administered   . Influenza Vaccine, 6 month-adult 01/11/2019         Past Medical/Social/Family/Surgical History:       Past Medical History:   Diagnosis Date   . Colon polyps    . Coronary artery disease involving native coronary artery of native heart 12/13/2018   . Esophageal reflux    . Hypercholesteremia 01/11/2019   . Hypertension    . Migraine    . Schatzki's ring    . Sleep apnea          Past Surgical History:   Procedure Laterality Date   . HX CHOLECYSTECTOMY     . KNEE SURGERY      Right knee         Family Medical History:     Problem Relation (Age of Onset)    Alzheimer's/Dementia Paternal Grandmother    Anesthesia Complications Paternal Aunt    Asthma Maternal Grandfather    Blood Clots Mother, Maternal Uncle, Maternal Grandfather    Cancer Mother,  Maternal Grandfather    Congestive Heart Failure Father, Paternal Grandfather    Diabetes Father, Paternal Grandfather    Heart Attack Maternal Grandfather, Paternal Grandfather    High Cholesterol Maternal Grandmother    Hypertension (High Blood Pressure) Father, Paternal Grandfather    Migraines Mother    Parkinsons Disease Maternal Uncle    Sleep disorders Maternal Aunt    Stroke Father, Paternal Grandmother    Sudden Death no cause Maternal Grandfather            Social History     Socioeconomic History   . Marital status: Divorced     Spouse name: Not on file   . Number of children: Not on file   . Years of education: Not on file   . Highest education level: Not on file   Tobacco Use   . Smoking status: Never Smoker   . Smokeless tobacco: Never Used   Vaping Use   . Vaping Use: Never used   Substance and Sexual Activity   . Alcohol use: Never   . Drug use: Never     Social Determinants of Health     Financial Resource Strain:    . Difficulty of Paying Living Expenses:    Food Insecurity:    . Worried  About Charity fundraiser in the Last Year:    . Arboriculturist in the Last Year:    Transportation Needs:    . Film/video editor (Medical):    Marland Kitchen Lack of Transportation (Non-Medical):    Physical Activity:    . Days of Exercise per Week:    . Minutes of Exercise per Session:    Stress:    . Feeling of Stress :    Intimate Partner Violence:    . Fear of Current or Ex-Partner:    . Emotionally Abused:    Marland Kitchen Physically Abused:    . Sexually Abused:                Review of Systems:   cardiac: no chest pain, palpitations  Pulmonary: no cough or shortness of breath at rest  All other systems reviewed and negative unless otherwise noted in HPI.      Objective:   Vitals:    Vitals:    10/01/19 1551 10/01/19 1614   BP: (!) 152/92 130/66   Temp: 36.5 C (97.7 F)    TempSrc: Thermal Scan    SpO2: 97%    Weight: (!) 173 kg (380 lb 15.3 oz)    Height: 1.88 m (6\' 2" )    BMI: 49.01           Body mass index is 48.91 kg/m.    Wt Readings from Last 6 Encounters:   10/01/19 (!) 173 kg (380 lb 15.3 oz)   04/23/19 (!) 169 kg (372 lb 9.2 oz)   04/01/19 (!) 167 kg (368 lb 13.3 oz)   01/11/19 (!) 166 kg (366 lb 10 oz)   12/13/18 (!) 159 kg (349 lb 13.9 oz)   09/05/18 (!) 162 kg (358 lb 0.4 oz)         Physical Exam  Vitals and nursing note reviewed.   Constitutional:       General: He is not in acute distress.     Appearance: He is not ill-appearing, toxic-appearing or diaphoretic.   HENT:      Head: Normocephalic and atraumatic.      Right Ear: External ear normal.  Left Ear: External ear normal.   Eyes:      General: No scleral icterus.        Right eye: No discharge.         Left eye: No discharge.      Extraocular Movements: Extraocular movements intact.   Pulmonary:      Effort: Pulmonary effort is normal. No respiratory distress.   Musculoskeletal:      Lumbar back: Spasms and tenderness present. No swelling or bony tenderness. Normal range of motion.        Back:    Skin:     General: Skin is warm and dry.       Findings: No erythema or rash.   Neurological:      Mental Status: He is alert.   Psychiatric:         Attention and Perception: He is attentive.         Mood and Affect: Mood is not anxious or depressed.         Speech: He is communicative.         Cognition and Memory: Cognition is not impaired.           Urine Dip Results:   Time collected: 1608  Glucose: Negative  Bilirubin: Negative  Ketones: (!) Trace (5 mg/dl)  Urine Specific Gravity: 1.030  Blood (urine): Negative  pH: 6.0  Protein: Negative  Urobilinogen: 0.2mg /dL (Normal)  Nitrite: Negative  Leukocytes: Negative    Blood  Results:         Rapid Strep Results:         Rapid Flu                 Assessment & Plan:     1. Essential hypertension  Chronic and stable, continue current medication, check basic metabolic panel with electrolytes and kidney function  - BASIC METABOLIC PANEL; Future    2. Acute left-sided low back pain without sciatica  Acute and not improving  Likely related to diagnosis 3.  Will check basic metabolic panel for kidney function, point of care urine testing for potential urinary tract infection or kidney stone   - BASIC METABOLIC PANEL; Future  - POCT Urine dipstick - completed in office today, personally reviewed and interpreted, not infectious, no obvious signs of kidney stone either as there is no blood    3. Muscle spasm  Acute, not improving.  Likely related to diagnosis 2. New prescription as below  Continue to monitor  - methocarbamoL (ROBAXIN) 750 mg Oral Tablet; Take 2 Tablets (1,500 mg total) by mouth Four times a day for 2 days, THEN 2 Tablets (1,500 mg total) Three times a day for 8 days.  Dispense: 64 Tablet; Refill: 0    4. Elevated hemoglobin A1c  Chronic, stability to be determined upcoming lab work  - HGA1C (HEMOGLOBIN A1C WITH EST AVG GLUCOSE); Future    5. Polyp of colon, unspecified part of colon, unspecified type  Updating outside medical record, awaiting outside medical results    6. Lower esophageal ring  (Schatzki)  Updating outside medical record, awaiting outside medical results      For all new medications prescribed on this visit, side effects, alternatives, risks and benefits were discussed and all patient's questions were answered.            Health Maintenance   Topic Date Due   . Covid-19 Vaccine (1 of 2) Never done   . HIV Screening  Never done   .  Adult Tdap-Td (1 - Tdap) Never done   . Influenza Vaccine (1) 11/13/2019   . Depression Screening  01/11/2020       Return if symptoms worsen or fail to improve.    The patient has been educated and verbalized understanding regarding the services provided during this visit.        Janalee Dane, DO  10/01/2019, 16:08    The date and time stamp above reflect the action of opening and starting the note and do not reflect the visit start time, end time or visit length.    This note was created using dictation software.  It was not reviewed for spelling or grammar in errors.

## 2019-10-02 ENCOUNTER — Encounter (INDEPENDENT_AMBULATORY_CARE_PROVIDER_SITE_OTHER): Payer: Self-pay | Admitting: Internal Medicine

## 2019-10-02 ENCOUNTER — Other Ambulatory Visit (INDEPENDENT_AMBULATORY_CARE_PROVIDER_SITE_OTHER): Payer: Self-pay | Admitting: Internal Medicine

## 2019-10-02 DIAGNOSIS — E119 Type 2 diabetes mellitus without complications: Secondary | ICD-10-CM

## 2019-10-02 MED ORDER — METFORMIN 500 MG TABLET
ORAL_TABLET | ORAL | 0 refills | Status: DC
Start: 2019-10-02 — End: 2020-05-07

## 2019-10-25 ENCOUNTER — Other Ambulatory Visit (INDEPENDENT_AMBULATORY_CARE_PROVIDER_SITE_OTHER): Payer: Self-pay | Admitting: Internal Medicine

## 2019-10-25 DIAGNOSIS — E119 Type 2 diabetes mellitus without complications: Secondary | ICD-10-CM

## 2019-10-25 NOTE — Telephone Encounter (Signed)
Last OV 10-01-19  No future appt  Loleta Books, Ambulatory Care Assistant  10/25/2019, 09:39

## 2020-03-03 ENCOUNTER — Other Ambulatory Visit (HOSPITAL_BASED_OUTPATIENT_CLINIC_OR_DEPARTMENT_OTHER): Payer: Self-pay | Admitting: Student in an Organized Health Care Education/Training Program

## 2020-03-19 ENCOUNTER — Ambulatory Visit (INDEPENDENT_AMBULATORY_CARE_PROVIDER_SITE_OTHER): Payer: BC Managed Care – PPO | Admitting: Medical

## 2020-03-19 ENCOUNTER — Other Ambulatory Visit: Payer: Self-pay

## 2020-03-19 ENCOUNTER — Encounter (INDEPENDENT_AMBULATORY_CARE_PROVIDER_SITE_OTHER): Payer: Self-pay | Admitting: Medical

## 2020-03-19 VITALS — BP 140/90 | HR 72 | Temp 97.1°F | Ht 74.0 in | Wt 370.8 lb

## 2020-03-19 DIAGNOSIS — G43909 Migraine, unspecified, not intractable, without status migrainosus: Secondary | ICD-10-CM

## 2020-03-19 DIAGNOSIS — R21 Rash and other nonspecific skin eruption: Secondary | ICD-10-CM

## 2020-03-19 DIAGNOSIS — R110 Nausea: Secondary | ICD-10-CM

## 2020-03-19 MED ORDER — DEXAMETHASONE 4 MG TABLET
ORAL_TABLET | ORAL | 0 refills | Status: DC
Start: 2020-03-19 — End: 2020-05-07

## 2020-03-19 MED ORDER — PANTOPRAZOLE 20 MG TABLET,DELAYED RELEASE
20.0000 mg | DELAYED_RELEASE_TABLET | Freq: Every day | ORAL | 5 refills | Status: DC
Start: 2020-03-19 — End: 2020-05-07

## 2020-03-19 NOTE — Progress Notes (Signed)
INTERNAL MEDICINE, [facility]  [address]  [place] [address]  [phone]  Name: Seth Valenzuela  MRN: [id number]  Date of Birth: [birth year]  Age: 45 y.o.  Date: 03/19/2020    Reason for Visit: Nausea Juliann Pares  Sun ), Eye Pain (  R X Tues ), and Rash ( Chest  area , itchy,  X 12-24)    History of Present Illness    The patient presents today as a call-in complaining of multiple new complaints.  He states on Sunday he ate some shrimp with cocktail sauce.  Shortly thereafter he developed nausea.  No associated vomiting.  No diarrhea.  Since that time he has had nausea this has been coming and going.  Mild epigastric and left upper quadrant abdominal pain.  No change in bowel habits.  He does have underlying GERD for which he takes Protonix and is in need of a refill today.  He also notes mild right-sided headache and pain behind his right eye that has been coming and going since Tuesday.  It briefly improves but does not resolve.  No vision change.  No facial numbness, tingling or droop.  No rash.  He also has had patchy rash on his chest in on his left ear that is pruritic.  No new soaps, detergents, contacts or known exposure to new allergens.    Patient History  Past Medical History:   Diagnosis Date   . Colon polyps    . Coronary artery disease involving native coronary artery of native heart 12/13/2018   . Esophageal reflux    . Hypercholesteremia 01/11/2019   . Hypertension    . Migraine    . Schatzki's ring    . Sleep apnea          Past Surgical History:   Procedure Laterality Date   . HX CHOLECYSTECTOMY     . KNEE SURGERY      Right knee         Current Outpatient Medications   Medication Sig   . amLODIPine (NORVASC) 5 mg Oral Tablet Take 5 mg by mouth Once a day   . aspirin (ECOTRIN) 81 mg Oral Tablet, Delayed Release (E.C.) Take 81 mg by mouth Once a day   . cetirizine (ZYRTEC) 10 mg Oral Tablet Take 10 mg by mouth Once a day   . dexAMETHasone (DECADRON) 4 mg Oral Tablet TID x  1 day, BID x 1 day, daily x 1 day   . metFORMIN (GLUCOPHAGE) 500 mg Oral Tablet Take 1 Tablet (500 mg total) by mouth Every morning with breakfast for 14 days, THEN 1 Tablet (500 mg total) Twice daily with food for 30 days.   . nebivoloL (BYSTOLIC) 5 mg Oral Tablet Take 0.5 Tablets (2.5 mg total) by mouth Once a day   . olmesartan (BENICAR) 40 mg Oral Tablet Take 1 Tab (40 mg total) by mouth Once a day   . pantoprazole (PROTONIX) 20 mg Oral Tablet, Delayed Release (E.C.) Take 1 Tablet (20 mg total) by mouth Once a day   . Tretinoin (RETIN-A) 0.025 % Cream APPLY TOPICALLY EVERY NIGHT     Allergies   Allergen Reactions   . Codeine      Family Medical History:     Problem Relation (Age of Onset)    Alzheimer's/Dementia Paternal Grandmother    Anesthesia Complications Paternal Aunt    Asthma Maternal Grandfather    Blood Clots Mother, Maternal Uncle, Maternal Grandfather  Cancer Mother, Maternal Grandfather    Congestive Heart Failure Father, Paternal Grandfather    Diabetes Father, Paternal Grandfather    Heart Attack Maternal Grandfather, Paternal Grandfather    High Cholesterol Maternal Grandmother    Hypertension (High Blood Pressure) Father, Paternal Grandfather    Migraines Mother    Parkinsons Disease Maternal Uncle    Sleep disorders Maternal Aunt    Stroke Father, Paternal Grandmother    Sudden Death no cause Maternal Grandfather          Social History     Socioeconomic History   . Marital status: Divorced     Spouse name: Not on file   . Number of children: Not on file   . Years of education: Not on file   . Highest education level: Not on file   Occupational History   . Not on file   Tobacco Use   . Smoking status: Never Smoker   . Smokeless tobacco: Never Used   Vaping Use   . Vaping Use: Never used   Substance and Sexual Activity   . Alcohol use: Never   . Drug use: Never   . Sexual activity: Not on file   Other Topics Concern   . Not on file   Social History Narrative   . Not on file     Social  Determinants of Health     Financial Resource Strain: Not on file   Food Insecurity: Not on file   Transportation Needs: Not on file   Physical Activity: Not on file   Stress: Not on file   Intimate Partner Violence: Not on file       Review of Systems  BP (!) 140/90   Pulse 72   Temp 36.2 C (97.1 F) (Thermal Scan)   Ht 1.88 m (6\' 2" )   Wt (!) 168 kg (370 lb 13 oz)   SpO2 96%   BMI 47.61 kg/m       Constitutional: No fevers, chills, malaise or abnormal weight loss.    HENT: No tinnitus, vertigo, hearing loss, ear pain, sinus drainage, nasal congestion or throat pain.  Eyes: No vision changes, diplopia, drainage, pain  Respiratory: No cough, shortness of breath, wheezing  Cardiovascular: No chest pain, PND, orthopnea, palpitations, lower extremity edema.    Gastrointestinal: No abdominal pain, + nausea, no vomiting, diarrhea, constipation, melana or hematochezia   Endocrine: No polyuria, polydipsia, heat or cold intolerance.  Genitourinary: No dysuria, urgency, frequency, hematuria, nocturia.  Musculoskeletal: No myalgias, arthralgias.  Skin:  +chest and left ear rashes.  No suspicious lesions.  Neurological:  + headaches right-sided with eye pain, no weakness, paresthesias  Hematological:  No bleeding, spontaneous bruising.  Psychiatric/Behavioral: No confusion, agitation, hallucinations, paranoia, delusions.    All other systems reviewed and are negative.    Physical Examination:  Vitals reviewed.  Constitutional: He is oriented to person, place, and time.  He appears well-developed and well-nourished.   HEENT:  Normocephalic, atraumatic.  TM's clear and intact bilaterally.  Canals clear.  Nose clear with drainage.  Normal appearing nasal mucosa.  Posterior pharynx clear without lesions or exudate. Mucous membranes moist without lesions.   Neck: Supple. Thyroid normal without enlargement or palpable nodules.  Trachea midline.  Cardiovascular: Normal rate and rhythm without murmurs, rubs or gallops.  No  JVD.  No bruits.  Pulmonary/Chest: Lungs clear without wheezes, rales or rhonchi.  No chest wall tenderness or deformity.   Abdominal: Soft. Nontender, nondistended with normal bowel  sounds in all quadrants.  No guarding, rebound , abnormal masses.  Rectal exam deferred.  Musculoskeletal: Full and painless ROM in all joint groups.  Neurological: Alert and oriented x 4.  Cranial nerves 2-12 intact without defecits.  DTR's 2+ BUE's/BLE's.  Strength and sensation intact in all extremeties.   Skin:  There is 1 small patches of erythematous raised, rough skin on his chest and a few similar lesions on his left ear.  Psychiatric:  Normal mood and affect. Speech is normal and behavior is normal. Judgment and thought content normal. Cognition and memory are normal.       Assessment and Plan    (G43.909) Migraine  (primary encounter diagnosis)  Plan:  Waxing and waning for the past several days but not resolving completely.  Rx issued for dexamethasone 4 mg t.i.d. x1 day, b.i.d. x1 day, q.d. x1 day.  Notify me if symptoms fail to improve with that regimen.      (R21) Rash and nonspecific skin eruption  Plan:  He can continue to apply topical steroid which does seem to be helping.  Will also monitor symptoms with course of dexamethasone as above.  Notify me if rash fails to fully resolve.       (R11.0) Nausea  Plan:  Sounds to be related to the stream he ate versus mild viral GI illness.  Did recommend resumption of Protonix, brat diet and increase fluids.  If symptoms fail to improve with this conservative management he will notify me.        Orders Placed This Encounter   . pantoprazole (PROTONIX) 20 mg Oral Tablet, Delayed Release (E.C.)   . dexAMETHasone (DECADRON) 4 mg Oral Tablet               Return if symptoms worsen or fail to improve.      The patient has been educated and verbalized understanding regarding the services provided during this visit.      Pershing Cox, PA-C  03/19/2020, 11:09        The  supervising/collaborating physician on site for this visit was Dr. Marguerite Olea.

## 2020-04-21 ENCOUNTER — Other Ambulatory Visit: Payer: Self-pay

## 2020-04-21 ENCOUNTER — Ambulatory Visit
Payer: BC Managed Care – PPO | Attending: Dermatology | Admitting: Student in an Organized Health Care Education/Training Program

## 2020-04-21 VITALS — BP 170/94 | HR 69 | Temp 97.7°F | Wt 376.3 lb

## 2020-04-21 DIAGNOSIS — D2239 Melanocytic nevi of other parts of face: Secondary | ICD-10-CM

## 2020-04-21 DIAGNOSIS — D485 Neoplasm of uncertain behavior of skin: Secondary | ICD-10-CM | POA: Insufficient documentation

## 2020-04-21 NOTE — Progress Notes (Addendum)
Dermatology, [facility]  [place] [address]  [phone]    Date:   04/21/2020  Name: Seth Valenzuela  Age: 45 y.o.    Chief complaint: Skin Lesions and Skin Check    HPI  Seth Valenzuela is a 45 y.o. male presenting for skin check. Mentions lesion on left nose bleeds sometimes.  Forms cyst-like lesions around ears but these have resolved.  He otherwise denies any new, changing, bleeding, or rapidly growing lesions.  No other skin-related complaints.  No personal history of skin cancer.   Family history of precancerous lesions in grandfather.     Review of Systems   Constitutional: Negative for chills and fever.   Skin: Negative for itching and rash.     Current Medications   amLODIPine (NORVASC) 5 mg Oral Tablet Take 5 mg by mouth Once a day    aspirin (ECOTRIN) 81 mg Oral Tablet, Delayed Release (E.C.) Take 81 mg by mouth Once a day    cetirizine (ZYRTEC) 10 mg Oral Tablet Take 10 mg by mouth Once a day    dexAMETHasone (DECADRON) 4 mg Oral Tablet TID x 1 day, BID x 1 day, daily x 1 day    metFORMIN (GLUCOPHAGE) 500 mg Oral Tablet Take 1 Tablet (500 mg total) by mouth Every morning with breakfast for 14 days, THEN 1 Tablet (500 mg total) Twice daily with food for 30 days.    nebivoloL (BYSTOLIC) 5 mg Oral Tablet Take 0.5 Tablets (2.5 mg total) by mouth Once a day    olmesartan (BENICAR) 40 mg Oral Tablet Take 1 Tab (40 mg total) by mouth Once a day    pantoprazole (PROTONIX) 20 mg Oral Tablet, Delayed Release (E.C.) Take 1 Tablet (20 mg total) by mouth Once a day    Tretinoin (RETIN-A) 0.025 % Cream APPLY TOPICALLY EVERY NIGHT     Allergies   Allergen Reactions    Codeine      Past Medical History:   Diagnosis Date    Colon polyps     Coronary artery disease involving native coronary artery of native heart 12/13/2018    Esophageal reflux     Hypercholesteremia 01/11/2019    Hypertension     Migraine     Schatzki's ring     Sleep apnea       Physical Exam  Vitals: Blood pressure (!) 170/94, pulse 69, temperature 36.5 C (97.7 F), temperature source Thermal Scan, weight (!) 171 kg (376 lb 5.2 oz).  Physical Exam  Constitutional:       General: He is not in acute distress.     Appearance: Normal appearance. He is not diaphoretic.   HENT:      Head:     Skin:     General: Skin is warm and dry.   Neurological:      Mental Status: He is alert and oriented to person, place, and time.     General skin exam was performed including head, neck, anterior/posterior trunk, bilateral upper, and lower extremities and revealed no areas of concern other than those documented.     Assessment and Plan  Problem List Items Addressed This Visit    None       Visit Diagnoses       Neoplasm of uncertain behavior of skin    -  Primary    Relevant Orders    SURGICAL PATHOLOGY SPECIMEN    11310 - SHAVE SINGLE EPIDERMAL/DERMAL LESION .  05CM OR LESS, FACE/EARS/EYELIDS/NOSE/LIPS/ MUCOUS MEMBRANE (AMB ONLY)          1. 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 Consent obtained, area cleaned, and anesthetized. A shave biopsy was performed.  Aluminum chloride was used for hemostasis. Vaseline and bandage were placed over the wound and wound care instructions were given. Patient demonstrated understanding of the instructions. Patient was advised that it would take approximately 2-3 weeks for the pathology results to be available and that I will personally contact the patient at the number provided to further discuss the pathology results.      The patient was educated on the importance of avoiding excessive sun exposure and wearing sunscreen daily.  Advised patient to re-apply sunscreen every 2-3 hours. Advised to check skin routinely for any changes, especially any new moles or changes in existing moles.    Follow up in 12 months, or  sooner if needed     I am scribing for, and in the presence of, Dr. Arnoldo Morale for services provided on 04/21/2020.  [employer], SCRIBE      I personally performed the services described in this documentation, as scribed  in my presence, and it is both accurate and complete.    Rosetta Posner, MD  04/21/2020, 14:06    I personally performed the services described in this documentation, as scribed  in my presence, and it is both accurate  and complete.    See resident's note for details.  I saw and examined the patient and agree with the resident's findings and plan as written except as noted, and I was present and supervised/observed all procedures in their entirety.     Ricki Miller, MD  04/22/2020, 13:09

## 2020-04-21 NOTE — Nursing Note (Signed)
04/21/20 1100   [place] Tasks Completed - Select Each Item Completed and Document Details Below   Medication Administration Yes   Medication Administration   Initials CH   Medication  Lidocaine with Epi   Medication Volume Given (mL) 0.1 ml   Route of Administration Intradermal   Site   (L nasal ala)   NDC # [phone]   LOT # [id number]   Expiration date 02/11/21   Manufacturer [place] Yes   Patient Supplied No   Comments: Administered by Dr. Ofilia Neas, [place]  04/21/2020, 11:26

## 2020-04-21 NOTE — Procedures (Signed)
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 Consent obtained, area cleaned, and anesthetized. A shave biopsy was performed.  Aluminum chloride was used for hemostasis. Vaseline and bandage were placed over the wound and wound care instructions were given. Patient demonstrated understanding of the instructions. Patient was advised that it would take approximately 2-3 weeks for the pathology results to be available and that I will personally contact the patient at the number provided to further discuss the pathology results.      See resident's note for details.  I saw and examined the patient and agree with the resident's findings and plan as written except as noted, and I was present and supervised/observed all procedures in their entirety.     Ricki Miller, MD  04/22/2020, 13:09

## 2020-04-23 LAB — SURGICAL PATHOLOGY SPECIMEN

## 2020-05-07 ENCOUNTER — Other Ambulatory Visit: Payer: Self-pay

## 2020-05-07 ENCOUNTER — Encounter (FREE_STANDING_LABORATORY_FACILITY): Payer: BC Managed Care – PPO | Admitting: Internal Medicine

## 2020-05-07 ENCOUNTER — Encounter (INDEPENDENT_AMBULATORY_CARE_PROVIDER_SITE_OTHER): Payer: Self-pay | Admitting: Internal Medicine

## 2020-05-07 ENCOUNTER — Encounter (FREE_STANDING_LABORATORY_FACILITY)
Admit: 2020-05-07 | Discharge: 2020-05-07 | Disposition: A | Discharge status: Home / Self Care | Payer: BC Managed Care – PPO | Attending: Internal Medicine | Admitting: Internal Medicine

## 2020-05-07 ENCOUNTER — Ambulatory Visit (INDEPENDENT_AMBULATORY_CARE_PROVIDER_SITE_OTHER): Payer: BC Managed Care – PPO | Admitting: Internal Medicine

## 2020-05-07 ENCOUNTER — Other Ambulatory Visit (INDEPENDENT_AMBULATORY_CARE_PROVIDER_SITE_OTHER): Payer: BC Managed Care – PPO

## 2020-05-07 VITALS — BP 138/80 | HR 80 | Ht 74.0 in | Wt 375.9 lb

## 2020-05-07 DIAGNOSIS — E7800 Pure hypercholesterolemia, unspecified: Secondary | ICD-10-CM | POA: Insufficient documentation

## 2020-05-07 DIAGNOSIS — I10 Essential (primary) hypertension: Secondary | ICD-10-CM

## 2020-05-07 DIAGNOSIS — E119 Type 2 diabetes mellitus without complications: Secondary | ICD-10-CM

## 2020-05-07 DIAGNOSIS — G4733 Obstructive sleep apnea (adult) (pediatric): Secondary | ICD-10-CM

## 2020-05-07 DIAGNOSIS — K219 Gastro-esophageal reflux disease without esophagitis: Secondary | ICD-10-CM

## 2020-05-07 LAB — BASIC METABOLIC PANEL, FASTING
ANION GAP: 9 mmol/L (ref 4–13)
BUN/CREA RATIO: 20 (ref 6–22)
BUN: 17 mg/dL (ref 8–25)
CALCIUM: 9.7 mg/dL (ref 8.5–10.0)
CHLORIDE: 99 mmol/L (ref 96–111)
CO2 TOTAL: 24 mmol/L (ref 22–30)
CREATININE: 0.85 mg/dL (ref 0.75–1.35)
ESTIMATED GFR: >90 mL/min/BSA (ref >=60)
GLUCOSE: 244 mg/dL — ABNORMAL HIGH (ref 70–99)
POTASSIUM: 3.9 mmol/L (ref 3.5–5.1)
SODIUM: 132 mmol/L — ABNORMAL LOW (ref 136–145)

## 2020-05-07 LAB — LIPID PANEL
CHOL/HDL RATIO: 4.9
CHOLESTEROL: 187 mg/dL (ref 100–200)
HDL CHOL: 38 mg/dL — ABNORMAL LOW (ref >=50)
LDL CALC: 71 mg/dL (ref <100)
NON-HDL: 149 mg/dL (ref <=190)
TRIGLYCERIDES: 392 mg/dL — ABNORMAL HIGH (ref <150)
VLDL CALC: 78 mg/dL — ABNORMAL HIGH (ref <30)

## 2020-05-07 LAB — ALT (SGPT): ALT (SGPT): 54 U/L (ref 10–55)

## 2020-05-07 LAB — MICROALBUMIN/CREATININE RATIO, URINE, RANDOM
CREATININE RANDOM URINE: 93 mg/dL (ref 50–100)
MICROALBUMIN RANDOM URINE: 2.7 mg/dL
MICROALBUMIN/CREATININE RATIO RANDOM URINE: 29 mg/g (ref <30.0)

## 2020-05-07 LAB — HGA1C (HEMOGLOBIN A1C WITH EST AVG GLUCOSE)
ESTIMATED AVERAGE GLUCOSE: 229 mg/dL
HEMOGLOBIN A1C: 9.6 % — ABNORMAL HIGH (ref 4.0–5.6)

## 2020-05-07 MED ORDER — DULAGLUTIDE 0.75 MG/0.5 ML SUBCUTANEOUS PEN INJECTOR
0.7500 mg | PEN_INJECTOR | SUBCUTANEOUS | 0 refills | Status: DC
Start: 2020-05-07 — End: 2020-06-02

## 2020-05-07 MED ORDER — METFORMIN ER 500 MG TABLET,EXTENDED RELEASE 24 HR
1000.0000 mg | ORAL_TABLET | Freq: Two times a day (BID) | ORAL | 5 refills | Status: AC
Start: 2020-05-07 — End: ?

## 2020-05-07 MED ORDER — BLOOD SUGAR DIAGNOSTIC STRIPS
ORAL_STRIP | 3 refills | Status: DC
Start: 2020-05-07 — End: 2020-11-02

## 2020-05-07 MED ORDER — PANTOPRAZOLE 40 MG TABLET,DELAYED RELEASE
40.0000 mg | DELAYED_RELEASE_TABLET | Freq: Every day | ORAL | 3 refills | Status: AC
Start: 2020-05-07 — End: ?

## 2020-05-07 NOTE — Progress Notes (Signed)
INTERNAL MEDICINE, [facility]  [address]  [place] [address]  [phone]  Name: Seth Valenzuela  MRN: [id number]  Date of Birth: November 17, 1975  Age: 45 y.o.  Date: 05/07/2020    Reason for Visit: Nausea    History of Present Illness  Patient presents today for evaluation of chronic nausea over the last week.  Seems to wax and wane.  Cannot really say that is related to food intake.  Denies any vomiting.  No change in bowel habits.  Is on Protonix just 20 mg once daily.  Also concerned about his blood sugars.  He is currently on Glucophage 500 mg twice daily.  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 Has not had an A1c drawn on a while.  His blood sugar readings at home of also been running in the high 200s.      Patient History  Past Medical History:   Diagnosis Date    Colon polyps     Coronary artery disease involving native coronary artery of native heart 12/13/2018    Esophageal reflux     Hypercholesteremia 01/11/2019    Hypertension     Migraine     Schatzki's ring     Sleep apnea          Past Surgical History:   Procedure Laterality Date    HX CHOLECYSTECTOMY      KNEE SURGERY      Right knee         Current Outpatient Medications   Medication Sig    amLODIPine (NORVASC) 5 mg Oral Tablet Take 5 mg by mouth Once a day    aspirin (ECOTRIN) 81 mg Oral Tablet, Delayed Release (E.C.) Take 81 mg by mouth Once a day    Blood Sugar Diagnostic (ONETOUCH ULTRA TEST) Strip Test sugars once daily as directed    cetirizine (ZYRTEC) 10 mg Oral Tablet Take 10 mg by mouth Once a day    dulaglutide 0.75 mg/0.5 mL Subcutaneous Pen Injector Inject 0.5 mL (0.75 mg total) under the skin Every 7 days    metFORMIN (GLUCOPHAGE XR) 500 mg Oral Tablet Sustained Release 24 hr Take 2 Tablets (1,000 mg total) by mouth Twice daily    nebivoloL (BYSTOLIC) 5 mg Oral Tablet Take 0.5 Tablets (2.5 mg total) by mouth Once a day    olmesartan (BENICAR)  40 mg Oral Tablet Take 1 Tab (40 mg total) by mouth Once a day    pantoprazole (PROTONIX) 40 mg Oral Tablet, Delayed Release (E.C.) Take 1 Tablet (40 mg total) by mouth Once a day    Tretinoin (RETIN-A) 0.025 % Cream APPLY TOPICALLY EVERY NIGHT     Allergies   Allergen Reactions    Codeine      Family Medical History:     Problem Relation (Age of Onset)    Alzheimer's/Dementia Paternal Grandmother    Anesthesia Complications Paternal Aunt    Asthma Maternal Grandfather    Blood Clots Mother, Maternal Uncle, Maternal Grandfather    Cancer Mother, Maternal Grandfather    Congestive Heart Failure Father, Paternal Grandfather    Diabetes Father, Paternal Grandfather    Heart Attack Maternal Grandfather, Paternal Grandfather    High Cholesterol Maternal Grandmother    Hypertension (High Blood Pressure) Father, Paternal Grandfather    Migraines Mother    Parkinsons Disease Maternal Uncle    Sleep disorders Maternal Aunt    Stroke Father, Paternal Grandmother  Sudden Death no cause Maternal Grandfather          Social History     Socioeconomic History    Marital status: Divorced     Spouse name: Not on file    Number of children: Not on file    Years of education: Not on file    Highest education level: Not on file   Occupational History    Not on file   Tobacco Use    Smoking status: Never Smoker    Smokeless tobacco: Never Used   Vaping Use    Vaping Use: Never used   Substance and Sexual Activity    Alcohol use: Never    Drug use: Never    Sexual activity: Not on file   Other Topics Concern    Not on file   Social History Narrative    Not on file     Social Determinants of Health     Financial Resource Strain: Not on file   Food Insecurity: Not on file   Transportation Needs: Not on file   Physical Activity: Not on file   Stress: Not on file   Intimate Partner Violence: Not on file   Housing Stability: Not on file       Review of Systems  BP 138/80    Pulse 80    Ht 1.88 m (6\' 2" )    Wt (!) 171 kg (375  lb 14.2 oz)    SpO2 97%    BMI 48.26 kg/m       Constitutional: No fevers, chills, malaise or abnormal weight loss.    HENT: No tinnitus, vertigo, hearing loss, ear pain, sinus drainage, nasal congestion or throat pain.  Eyes: No vision changes, diplopia, drainage, pain  Respiratory: No cough, shortness of breath, wheezing  Cardiovascular: No chest pain, PND, orthopnea, palpitations, lower extremity edema.    Gastrointestinal: No abdominal pain, + nausea, no vomiting, diarrhea, constipation, melana or hematochezia   Endocrine: No polyuria, polydipsia, heat or cold intolerance.  Genitourinary: No dysuria, urgency, frequency, hematuria, nocturia.  Musculoskeletal: No myalgias, arthralgias.  Skin: No rashes, suspicious lesions.  Neurological: No headaches, weakness, paresthesias  Hematological:  No bleeding, spontaneous bruising.  Psychiatric/Behavioral: No confusion, agitation, hallucinations, paranoia, delusions.    All other systems reviewed and are negative.    Physical Examination:  Vitals reviewed.  Constitutional: He is oriented to person, place, and time.  He appears well-developed and well-nourished.   HEENT:  Normocephalic, atraumatic.  TM's clear and intact bilaterally.  Canals clear.  Nose clear with drainage.  Normal appearing nasal mucosa.  Posterior pharynx clear without lesions or exudate. Mucous membranes moist without lesions.   Neck: Supple. Thyroid normal without enlargement or palpable nodules.  Trachea midline.  Cardiovascular: Normal rate and rhythm without murmurs, rubs or gallops.  No JVD.  No bruits.  Pulmonary/Chest: Lungs clear without wheezes, rales or rhonchi.  No chest wall tenderness or deformity.   Abdominal: Soft.  Obese, nontender, nondistended with normal bowel sounds in all quadrants.  No guarding, rebound , abnormal masses.  Rectal exam deferred.  Musculoskeletal: Full and painless ROM in all joint groups.  Neurological: Alert and oriented x 4.  Cranial nerves 2-12 intact without  defecits.  DTR's 2+ BUE's/BLE's.  Strength and sensation intact in all extremeties.   Skin: Skin is warm and dry. No rash or suspicious skin lesions noted.  Psychiatric:  Normal mood and affect. Speech is normal and behavior is normal. Judgment and thought  content normal. Cognition and memory are normal.       Assessment and Plan    (I10) Essential hypertension  (primary encounter diagnosis)  Plan:  Blood pressure well controlled.  Continue amlodipine along with Bystolic and Benicar.    (E78.00) Hypercholesteremia  Plan: LIPID PANEL, ALT (SGPT)        Check nonfasting lipid profile today.  Will likely recommend statin therapy regardless of results due to his type 2 diabetes.    (G47.33) OSA (obstructive sleep apnea)  Plan:  Stable on nocturnal CPAP.  Continue q.h.s..    (K21.9) Gastroesophageal reflux disease without esophagitis  Plan:  Recommend increasing Protonix to 40 mg daily.  Monitor nausea response.    (E11.9) Type 2 diabetes mellitus without complication, without long-term current use of insulin (CMS HCC)  Plan: BASIC METABOLIC PANEL, FASTING, FTD3U         (HEMOGLOBIN A1C WITH EST AVG GLUCOSE),         MICROALBUMIN/CREATININE RATIO, URINE, RANDOM,         Refer to [facility] [place] over the Glucophage XR 500 mg 2 tabs twice daily.  Will also initiate Trulicity 2.02 mg subQ weekly.  Patient to update me on sugar readings before giving his 4th injection.  Will check nonfasting sugar along with A1c and urine for microalbumin today.  Referral was also made for diabetic eye exam.        Orders Placed This Encounter    BASIC METABOLIC PANEL, FASTING    HGA1C (HEMOGLOBIN A1C WITH EST AVG GLUCOSE)    LIPID PANEL    ALT (SGPT)    MICROALBUMIN/CREATININE RATIO, URINE, RANDOM    Refer to [facility]    metFORMIN (GLUCOPHAGE XR) 500 mg Oral Tablet Sustained Release 24 hr    dulaglutide 0.75 mg/0.5 mL Subcutaneous Pen Injector    Blood Sugar Diagnostic  (ONETOUCH ULTRA TEST) Strip    pantoprazole (PROTONIX) 40 mg Oral Tablet, Delayed Release (E.C.)         Return in about 3 months (around 08/04/2020).      The patient has been educated and verbalized understanding regarding the services provided during this visit.    Donna Bernard, MD

## 2020-05-25 ENCOUNTER — Other Ambulatory Visit: Payer: Self-pay

## 2020-05-25 ENCOUNTER — Encounter (INDEPENDENT_AMBULATORY_CARE_PROVIDER_SITE_OTHER): Payer: Self-pay | Admitting: Optometrist

## 2020-05-25 ENCOUNTER — Ambulatory Visit (INDEPENDENT_AMBULATORY_CARE_PROVIDER_SITE_OTHER): Payer: BC Managed Care – PPO | Admitting: Optometrist

## 2020-05-25 DIAGNOSIS — H5213 Myopia, bilateral: Secondary | ICD-10-CM

## 2020-05-25 DIAGNOSIS — H524 Presbyopia: Secondary | ICD-10-CM

## 2020-05-25 DIAGNOSIS — H52223 Regular astigmatism, bilateral: Secondary | ICD-10-CM

## 2020-05-25 DIAGNOSIS — E119 Type 2 diabetes mellitus without complications: Secondary | ICD-10-CM

## 2020-05-25 NOTE — Progress Notes (Addendum)
[place], [place]  [place] [address]  Dept: [phone]  Dept Fax: [phone]    Patient Name: Seth Valenzuela  MRN# [id number]    Date of Service: 05/25/2020    Chief Complaint:   Chief Complaint   Patient presents with    Diabetes     Lab Results       Component                Value               Date                       HA1C                     9.6 (H)             05/07/2020            Systemic.  Medications: trulicity, metformin.  Blood sugar 178 this morning.  Stable.       Past History  Current Outpatient Medications   Medication Sig    amLODIPine (NORVASC) 5 mg Oral Tablet Take 5 mg by mouth Once a day    aspirin (ECOTRIN) 81 mg Oral Tablet, Delayed Release (E.C.) Take 81 mg by mouth Once a day    Blood Sugar Diagnostic (ONETOUCH ULTRA TEST) Strip Test sugars once daily as directed    cetirizine (ZYRTEC) 10 mg Oral Tablet Take 10 mg by mouth Once a day    dulaglutide 0.75 mg/0.5 mL Subcutaneous Pen Injector Inject 0.5 mL (0.75 mg total) under the skin Every 7 days    metFORMIN (GLUCOPHAGE XR) 500 mg Oral Tablet Sustained Release 24 hr Take 2 Tablets (1,000 mg total) by mouth Twice daily    nebivoloL (BYSTOLIC) 5 mg Oral Tablet Take 0.5 Tablets (2.5 mg total) by mouth Once a day    olmesartan (BENICAR) 40 mg Oral Tablet Take 1 Tab (40 mg total) by mouth Once a day    pantoprazole (PROTONIX) 40 mg Oral Tablet, Delayed Release (E.C.) Take 1 Tablet (40 mg total) by mouth Once a day    Tretinoin (RETIN-A) 0.025 % Cream APPLY TOPICALLY EVERY NIGHT     Allergies   Allergen Reactions    Codeine      Past Medical History:   Diagnosis Date    Astigmatism of both eyes     Colon polyps     Coronary artery disease involving native coronary artery of native heart 12/13/2018    Esophageal reflux     Hypercholesteremia 01/11/2019    Hypertension     Migraine     Schatzki's ring     Sleep apnea          Past  Surgical History:   Procedure Laterality Date    HX CHOLECYSTECTOMY      KNEE SURGERY      Right knee         Family History  Family Medical History:     Problem Relation (Age of Onset)    Alzheimer's/Dementia Paternal Grandmother    Anesthesia Complications Paternal Aunt    Asthma Maternal Grandfather    Blood Clots Mother, Maternal Uncle, Maternal Grandfather    Cancer Mother, Maternal Grandfather    Cataract Father    Congestive Heart Failure Father, Paternal Grandfather    Diabetes Father, Paternal Grandfather    Heart Attack  Maternal Grandfather, Paternal Grandfather    High Cholesterol Maternal Grandmother    Hypertension (High Blood Pressure) Father, Paternal Grandfather    Macular Degen Mother    Migraines Mother    Parkinsons Disease Maternal Uncle    Sleep disorders Maternal Aunt    Stroke Father, Paternal Grandmother    Sudden Death no cause Maternal Grandfather          Social History  Social History     Tobacco Use    Smoking status: Never Smoker    Smokeless tobacco: Never Used   Substance Use Topics    Alcohol use: Never     Review of Systems       Seth Beath, RN 05/25/2020, 14:35      Seth Valenzuela  [facility]         Patient Name: Seth Valenzuela  MRN#: [id number]  Birthdate: 1975/05/02    Date of Service: 05/25/2020    Chief Complaint     Diabetes          Merril Nagy is a 45 y.o. male who presents today for evaluation/consultation of:  HPI     Diabetes      Additional comments: Lab Results       Component                Value               Date                       HA1C                     9.6 (H)             05/07/2020            Systemic.  Medications: trulicity, metformin.  Blood sugar 178 this morning.  Stable.          Last edited by Seth Beath, RN on 05/25/2020  2:23 PM. (History)        ROS     Positive for: Gastrointestinal (GERD), Neurological (OSA), Endocrine  (Hypercholesteremia,DM), Cardiovascular, Allergic/Imm    Negative for: Constitutional, Skin, Genitourinary, Musculoskeletal, HENT, Eyes, Respiratory, Psychiatric, Heme/Lymph    Last edited by Seth Beath, RN on 05/25/2020  2:24 PM. (History)         All other systems Negative  Base Eye Exam     Visual Acuity (Snellen - Linear)       Right Left    Dist cc 20/20-2 20/20 -1    Correction: Glasses          Tonometry (Tonopen, 2:34 PM)       Right Left    Pressure 23 16          Pupils       Pupils    Right PERRL    Left PERRL          Visual Fields (Counting fingers)       Right Left     Full Full          Extraocular Movement       Right Left     Full Full          Neuro/Psych     Oriented x3: Yes    Mood/Affect: Normal          Dilation  Both eyes: 0.5% Proparacaine @ 2:32 PM          Dilation #2     Both eyes: 1.0% Mydriacyl @ 2:34 PM          Dilation #3     Both eyes: 2.5% Phenylephrine @ 2:35 PM            Slit Lamp and Fundus Exam     External Exam       Right Left    External Normal Normal          Slit Lamp Exam       Right Left    Lids/Lashes Normal Normal    Conjunctiva/Sclera White and quiet White and quiet    Cornea Debris in tear film Debris in tear film    Anterior Chamber Deep and quiet Deep and quiet    Iris Round and reactive Round and reactive    Lens Clear Clear    Vitreous Normal Normal          Fundus Exam       Right Left    Disc Normal Normal    C/D Ratio 0.2 0.2    Macula Normal Normal    Vessels Normal, no DR  Normal, no DR     Periphery Normal Normal            Refraction     Wearing Rx       Sphere Cylinder Axis    Right -6.00 +4.75 104    Left -5.25 +4.50 091    [age]: 14.5 years    Type: SV          Manifest Refraction (Auto)       Sphere Cylinder Axis Dist VA Add [place]    Right -4.75 +4.00 097       Left -4.25 +4.25 094             Manifest Refraction #2       Sphere Cylinder Axis Dist VA Add [place]    Right -6.00 +4.75 103 20/25+2 +1.00 20/30 OU dilated    Left -4.50 +4.50 091 20/20  +1.00           Final Rx       Sphere Cylinder Axis Add    Right -6.00 +4.75 103 +1.00    Left -4.50 +4.50 091 +1.00    Type: Eyezen 3, relieve 70, PAL    Expiration Date: 05/26/2022                MD Addition to HPI: pt reports being prediabetic until last May his father past away and was stress eating. He gained 35 pounds. Recently he lost 10 pounds and is getting his sugars back under control via a low carb diet.           ENCOUNTER DIAGNOSES     ICD-10-CM   1. Type 2 diabetes mellitus without complication, without long-term current use of insulin (CMS HCC)  E11.9   2. Myopia, bilateral  H52.13   3. Regular astigmatism of both eyes  H52.223   4. Presbyopia  H52.4     No orders of the defined types were placed in this encounter.      Ophthalmic Plan of Care:  1. Type 2 diabetes mellitus without retinopathy OU  -Educated pt on importance of yearly eye exams, medication compliance, and regular follow-ups with Dr. Marguerite Olea.  -Stressed the importance of tight blood sugar control to prevent diabetic  complications.   -HEMOGLOBIN A1C  Lab Results   Component Value Date    HA1C 9.6 (H) 05/07/2020   -pt has recently lost 10 pounds and is currently on a low carb diet   -Monitor in 1 year     2-4. -Refractive error OU   Written glasses rx given to pt today    Recommended reading bump for pt.     Follow up:    I have asked Laramie Meissner to follow up 1 year CEE/DFE          I have seen and examined the above patient. I discussed the above diagnoses listed in the assessment and the above ophthalmic plan of care with the patient and patient's family. All questions were answered. I reviewed and, when necessary, made changes to the technician/resident note, documented ophthalmology exam, chief complaint, history of present illness, allergies, review of systems, past medical, past surgical, family and social history. I personally reviewed and interpreted all testing and/or imaging performed at this visit and agree with the  resident's or fellow's interpretation. Any exceptions/additions are edited/noted in the relevant encounter fields.      [place] Sabine Tenenbaum, [place]  05/25/2020, 15:56

## 2020-06-02 ENCOUNTER — Other Ambulatory Visit (INDEPENDENT_AMBULATORY_CARE_PROVIDER_SITE_OTHER): Payer: Self-pay | Admitting: Internal Medicine

## 2020-06-02 MED ORDER — DULAGLUTIDE 1.5 MG/0.5 ML SUBCUTANEOUS PEN INJECTOR
1.5000 mg | PEN_INJECTOR | SUBCUTANEOUS | 5 refills | Status: DC
Start: 2020-06-02 — End: 2020-12-09

## 2020-06-02 NOTE — Telephone Encounter (Signed)
Rx refill request  Last- 05/07/20  Next- not scheduled  Meyer Cory, [facility]  06/02/2020, 09:35

## 2020-07-01 ENCOUNTER — Encounter (INDEPENDENT_AMBULATORY_CARE_PROVIDER_SITE_OTHER): Payer: Self-pay | Admitting: Internal Medicine

## 2020-07-01 ENCOUNTER — Other Ambulatory Visit: Payer: Self-pay

## 2020-07-01 ENCOUNTER — Ambulatory Visit (INDEPENDENT_AMBULATORY_CARE_PROVIDER_SITE_OTHER): Payer: BC Managed Care – PPO | Admitting: Internal Medicine

## 2020-07-01 VITALS — BP 120/72 | HR 78 | Temp 97.3°F | Ht 74.0 in | Wt 369.3 lb

## 2020-07-01 DIAGNOSIS — R1032 Left lower quadrant pain: Secondary | ICD-10-CM

## 2020-07-01 NOTE — Progress Notes (Signed)
INTERNAL MEDICINE, [facility]  [address]  [place] [address]  [phone]  Name: Seth Valenzuela  MRN: [id number]  Date of Birth: 12-28-1975  Age: 45 y.o.  Date: 07/01/2020    Reason for Visit: Flank Pain (L side X Sat)    History of Present Illness  Patient presents today complaining of a 4 day history of intermittent stinging type pain in his left lower quadrant left flank area.  No history of trauma or injury.  No effect with bowel movement.  Bowels are moving on a regular basis.  Has not noticed anything that makes the pain worse or better.  Not affected by food intake.  Has not noticed any mass or lumps in that area.  Has not noticed any rash.  Denies any increase in low back pain.  Pain is not positional in nature.    Patient History  Past Medical History:   Diagnosis Date   . Astigmatism of both eyes    . Colon polyps    . Coronary artery disease involving native coronary artery of native heart 12/13/2018   . Esophageal reflux    . Hypercholesteremia 01/11/2019   . Hypertension    . Migraine    . Schatzki's ring    . Sleep apnea          Past Surgical History:   Procedure Laterality Date   . HX CHOLECYSTECTOMY     . KNEE SURGERY      Right knee         Current Outpatient Medications   Medication Sig   . amLODIPine (NORVASC) 5 mg Oral Tablet Take 5 mg by mouth Once a day   . aspirin (ECOTRIN) 81 mg Oral Tablet, Delayed Release (E.C.) Take 81 mg by mouth Once a day   . Blood Sugar Diagnostic (ONETOUCH ULTRA TEST) Strip Test sugars once daily as directed   . cetirizine (ZYRTEC) 10 mg Oral Tablet Take 10 mg by mouth Once a day   . dulaglutide 1.5 mg/0.5 mL Subcutaneous Pen Injector Inject 0.5 mL (1.5 mg total) under the skin Every 7 days   . metFORMIN (GLUCOPHAGE XR) 500 mg Oral Tablet Sustained Release 24 hr Take 2 Tablets (1,000 mg total) by mouth Twice daily   . nebivoloL (BYSTOLIC) 5 mg Oral Tablet Take 0.5 Tablets (2.5 mg total) by mouth Once a day   . olmesartan (BENICAR)  40 mg Oral Tablet Take 1 Tab (40 mg total) by mouth Once a day   . pantoprazole (PROTONIX) 40 mg Oral Tablet, Delayed Release (E.C.) Take 1 Tablet (40 mg total) by mouth Once a day   . Tretinoin (RETIN-A) 0.025 % Cream APPLY TOPICALLY EVERY NIGHT     Allergies   Allergen Reactions   . Codeine      Family Medical History:     Problem Relation (Age of Onset)    Alzheimer's/Dementia Paternal Grandmother    Anesthesia Complications Paternal Aunt    Asthma Maternal Grandfather    Blood Clots Mother, Maternal Uncle, Maternal Grandfather    Cancer Mother, Maternal Grandfather    Cataract Father    Congestive Heart Failure Father, Paternal Grandfather    Diabetes Father, Paternal Grandfather    Heart Attack Maternal Grandfather, Paternal Grandfather    High Cholesterol Maternal Grandmother    Hypertension (High Blood Pressure) Father, Paternal Grandfather    Macular Degen Mother    Migraines Mother    Parkinsons Disease Maternal Uncle    Sleep disorders  Maternal Aunt    Stroke Father, Paternal Grandmother    Sudden Death no cause Maternal Grandfather          Social History     Socioeconomic History   . Marital status: Divorced     Spouse name: Not on file   . Number of children: Not on file   . Years of education: Not on file   . Highest education level: Not on file   Occupational History   . Not on file   Tobacco Use   . Smoking status: Never Smoker   . Smokeless tobacco: Never Used   Vaping Use   . Vaping Use: Never used   Substance and Sexual Activity   . Alcohol use: Never   . Drug use: Never   . Sexual activity: Not on file   Other Topics Concern   . Not on file   Social History Narrative   . Not on file     Social Determinants of Health     Financial Resource Strain: Not on file   Food Insecurity: Not on file   Transportation Needs: Not on file   Physical Activity: Not on file   Stress: Not on file   Intimate Partner Violence: Not on file   Housing Stability: Not on file       Review of Systems  BP 120/72 (Site:  Manual, Patient Position: Sitting, Cuff Size: Adult Large)   Pulse 78   Temp 36.3 C (97.3 F) (Thermal Scan)   Ht 1.88 m (6\' 2" )   Wt (!) 168 kg (369 lb 4.3 oz)   SpO2 95%   BMI 47.41 kg/m       Constitutional: No fevers, chills, malaise or abnormal weight loss.    HENT: No tinnitus, vertigo, hearing loss, ear pain, sinus drainage, nasal congestion or throat pain.  Eyes: No vision changes, diplopia, drainage, pain  Respiratory: No cough, shortness of breath, wheezing  Cardiovascular: No chest pain, PND, orthopnea, palpitations, lower extremity edema.    Gastrointestinal: + abdominal pain, no nausea, vomiting, diarrhea, constipation, melana or hematochezia   Endocrine: No polyuria, polydipsia, heat or cold intolerance.  Genitourinary: No dysuria, urgency, frequency, hematuria, nocturia.  Musculoskeletal: No myalgias, arthralgias.  Skin: No rashes, suspicious lesions.  Neurological: No headaches, weakness, paresthesias  Hematological:  No bleeding, spontaneous bruising.  Psychiatric/Behavioral: No confusion, agitation, hallucinations, paranoia, delusions.    All other systems reviewed and are negative.    Physical Examination:  Vitals reviewed.  Constitutional: He is oriented to person, place, and time.  He appears well-developed and well-nourished.   HEENT:  Normocephalic, atraumatic.  TM's clear and intact bilaterally.  Canals clear.  Nose clear with drainage.  Normal appearing nasal mucosa.  Posterior pharynx clear without lesions or exudate. Mucous membranes moist without lesions.   Neck: Supple. Thyroid normal without enlargement or palpable nodules.  Trachea midline.  Cardiovascular: Normal rate and rhythm without murmurs, rubs or gallops.  No JVD.  No bruits.  Pulmonary/Chest: Lungs clear without wheezes, rales or rhonchi.  No chest wall tenderness or deformity.   Abdominal: Soft.  Obese, mild localized tenderness in the left lateral abdomen.  Appears to be a small nodule in this area possibly  representing a small ventral wall hernia., nondistended with normal bowel sounds in all quadrants.  No guarding, rebound , abnormal masses.  Rectal exam deferred.  Musculoskeletal: Full and painless ROM in all joint groups.  Neurological: Alert and oriented x 4.  Cranial nerves  2-12 intact without defecits.  DTR's 2+ BUE's/BLE's.  Strength and sensation intact in all extremeties.   Skin: Skin is warm and dry. No rash or suspicious skin lesions noted.  No hyperesthesia to touch of the skin.  Psychiatric:  Normal mood and affect. Speech is normal and behavior is normal. Judgment and thought content normal. Cognition and memory are normal.       Assessment and Plan    1. LLQ pain  Believe this pain is most likely secondary to a small ventral wall hernia.  Will schedule him for an abdominal wall ultrasound to evaluate for hernia in the area.  In the meantime recommended he treat any discomfort with p.r.n. Tylenol or ibuprofen.  Will notify him of results of ultrasound and recommendations accordingly.  - US ABDOMINAL WALL (R/O HERNIATION); Future      Orders Placed This Encounter   . US ABDOMINAL WALL (R/O HERNIATION)         No follow-ups on file.      The patient has been educated and verbalized understanding regarding the services provided during this visit.    Donna Bernard, MD

## 2020-07-03 ENCOUNTER — Encounter (INDEPENDENT_AMBULATORY_CARE_PROVIDER_SITE_OTHER): Payer: Self-pay | Admitting: Internal Medicine

## 2020-07-03 ENCOUNTER — Telehealth (INDEPENDENT_AMBULATORY_CARE_PROVIDER_SITE_OTHER): Payer: Self-pay | Admitting: Internal Medicine

## 2020-07-03 NOTE — Telephone Encounter (Signed)
Pt aware No hernia seen, call if pain persist into next week   Mayford Knife, MA  07/03/2020, 16:17

## 2020-07-29 ENCOUNTER — Ambulatory Visit (INDEPENDENT_AMBULATORY_CARE_PROVIDER_SITE_OTHER): Payer: Self-pay | Admitting: Internal Medicine

## 2020-08-04 ENCOUNTER — Encounter (INDEPENDENT_AMBULATORY_CARE_PROVIDER_SITE_OTHER): Payer: Self-pay | Admitting: Internal Medicine

## 2020-08-04 NOTE — Nursing Note (Signed)
Pt called in and states he has been having stabbing chest pain and a lot of head pressure .Pt was advised to go to the nearest ED  Mayford Knife, [place]  08/04/2020, 11:34

## 2020-11-02 ENCOUNTER — Other Ambulatory Visit (INDEPENDENT_AMBULATORY_CARE_PROVIDER_SITE_OTHER): Payer: Self-pay | Admitting: Internal Medicine

## 2020-11-02 MED ORDER — BLOOD SUGAR DIAGNOSTIC STRIPS
ORAL_STRIP | 3 refills | Status: AC
Start: 2020-11-02 — End: ?

## 2020-11-02 NOTE — Telephone Encounter (Signed)
Med Refill   LOV 07/01/2020  No Future Appt   Mardene Speak, Ambulatory Care Assistant  11/02/2020, 14:47

## 2020-12-09 ENCOUNTER — Other Ambulatory Visit (INDEPENDENT_AMBULATORY_CARE_PROVIDER_SITE_OTHER): Payer: Self-pay | Admitting: Internal Medicine

## 2020-12-09 MED ORDER — DULAGLUTIDE 1.5 MG/0.5 ML SUBCUTANEOUS PEN INJECTOR
1.5000 mg | PEN_INJECTOR | SUBCUTANEOUS | 5 refills | Status: AC
Start: 2020-12-09 — End: ?

## 2020-12-09 NOTE — Telephone Encounter (Signed)
Refill request  LOV 07-01-2020  No future appt.     Claudie Leach, Ambulatory Care Assistant  12/09/2020, 08:12

## 2021-05-24 ENCOUNTER — Encounter (INDEPENDENT_AMBULATORY_CARE_PROVIDER_SITE_OTHER): Payer: Self-pay | Admitting: Optometrist

## 2021-07-02 IMAGING — MR MRI LUMBAR SPINE WITHOUT CONTRAST
6 series · 48 of 48 positions shown · non-contrast
Comparison: No prior exams are available at this time for comparison.

﻿EXAM:  [id number]   MRI LUMBAR SPINE WITHOUT CONTRAST
INDICATION: Lifting injury. Right-sided back pain radiating into the right hip with right leg pain and numbness and right-sided groin pain.
TECHNIQUE: Noncontrast multiplanar, multisequence MRI was performed.  The patient's large size necessitated use of the body coil with attendant decrease in image quality somewhat limiting this examination.

[Series 4: T2 · sagittal · 5.0mm · 1.00mm/px · 5 of 13 slices shown (1 of 3)]
[im 1/13]
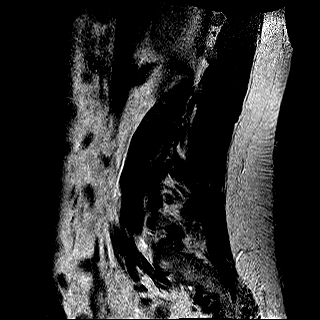
[im 4/13]
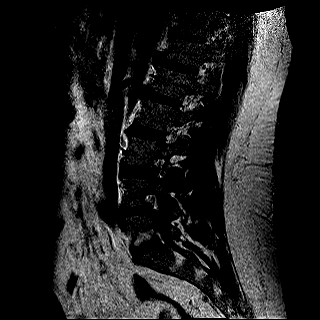
[im 7/13]
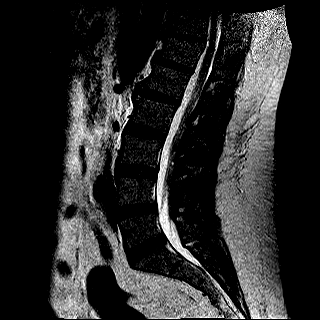
[im 10/13]
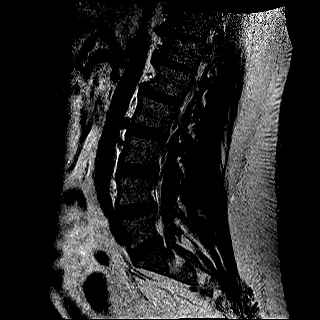
[im 13/13]
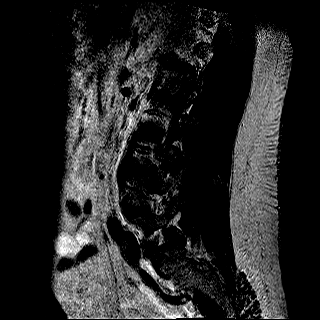

[Series 5: T1 · sagittal · 5.0mm · 1.00mm/px · 6 of 13 slices shown (1 of 2)]
[im 1/13]
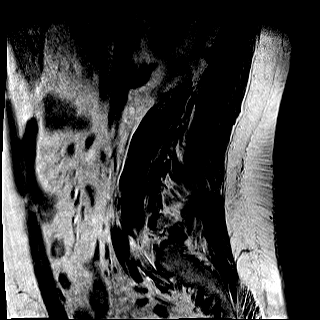
[im 3/13]
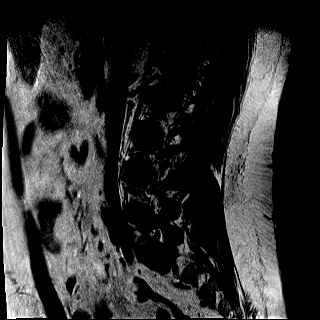
[im 5/13]
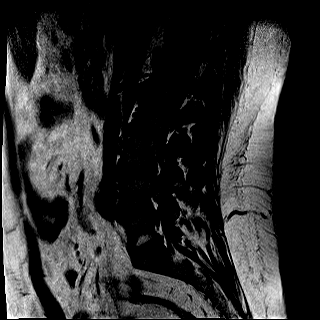
[im 8/13]
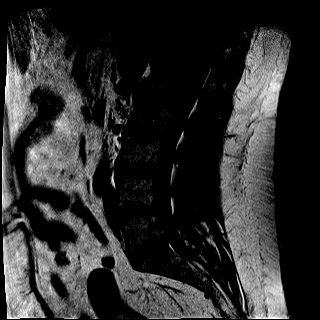
[im 10/13]
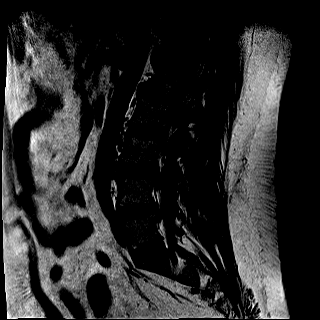
[im 13/13]
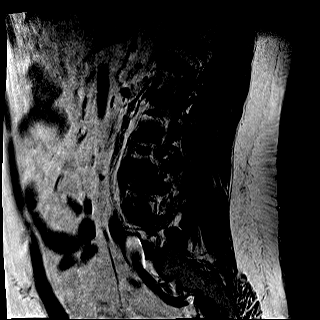

[Series 6: STIR · sagittal · 5.0mm · 1.25mm/px · 6 of 13 slices shown]
[im 1/13]
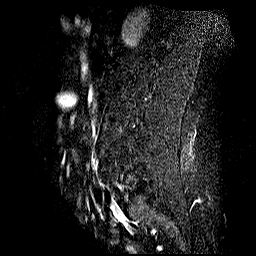
[im 3/13]
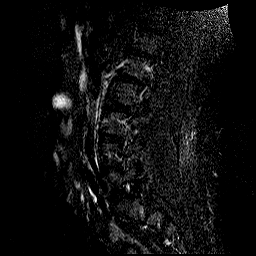
[im 5/13]
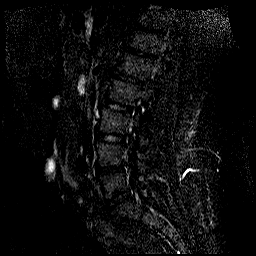
[im 8/13]
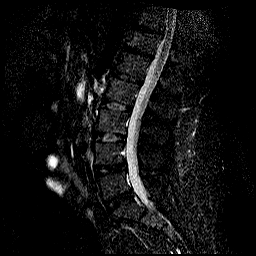
[im 10/13]
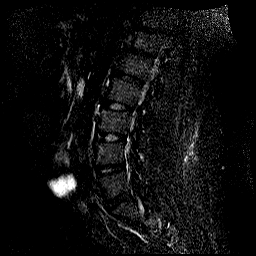
[im 13/13]
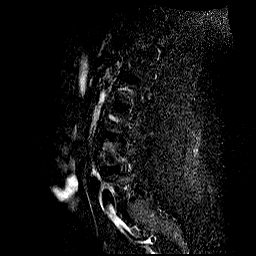

[Series 7: T2 · axial · 5.0mm · 0.89mm/px · z∈[-108,+131]mm · 11 of 25 slices shown (2 of 3)]
[im 1/25]
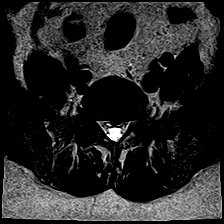
[im 3/25]
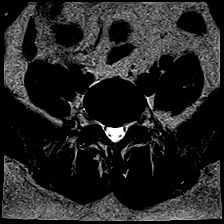
[im 5/25]
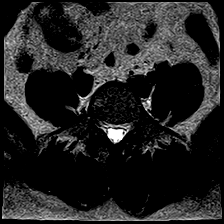
[im 8/25]
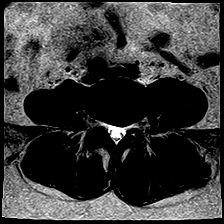
[im 10/25]
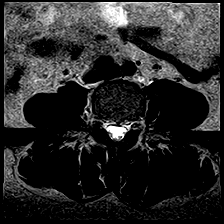
[im 13/25]
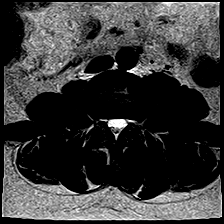
[im 15/25]
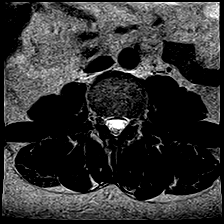
[im 17/25]
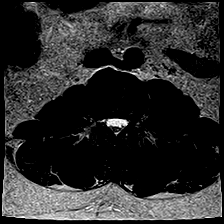
[im 20/25]
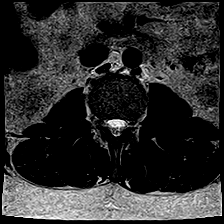
[im 22/25]
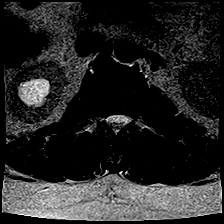
[im 25/25]
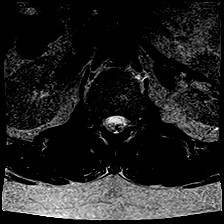

[Series 8: T1 · axial · 5.0mm · 0.89mm/px · z∈[-108,+131]mm · 11 of 25 slices shown (2 of 2)]
[im 1/25]
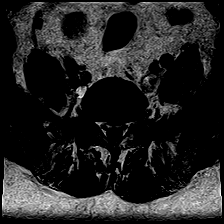
[im 3/25]
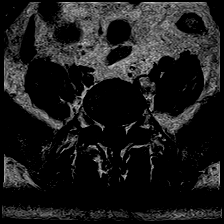
[im 5/25]
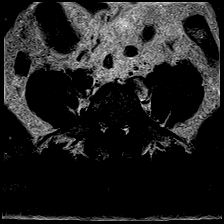
[im 8/25]
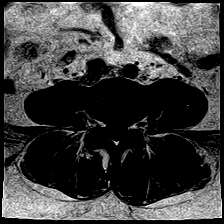
[im 10/25]
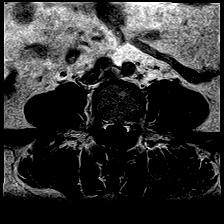
[im 13/25]
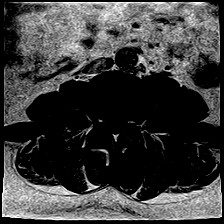
[im 15/25]
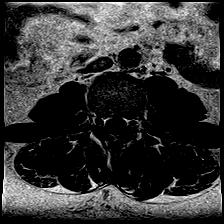
[im 17/25]
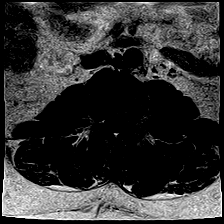
[im 20/25]
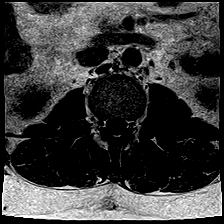
[im 22/25]
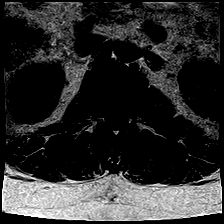
[im 25/25]
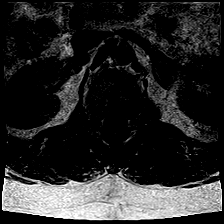

[Series 9: T2 · coronal · 4.0mm · 1.34mm/px · 9 of 20 slices shown (3 of 3)]
[im 1/20]
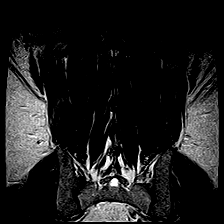
[im 3/20]
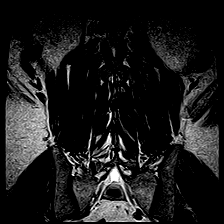
[im 5/20]
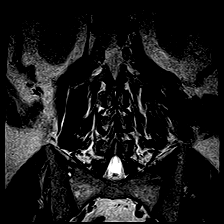
[im 8/20]
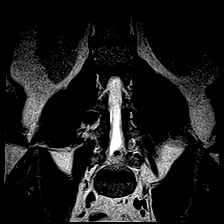
[im 10/20]
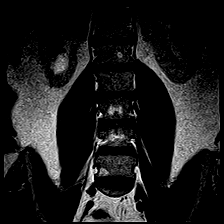
[im 12/20]
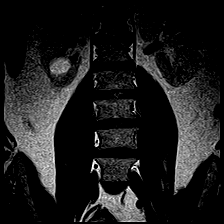
[im 15/20]
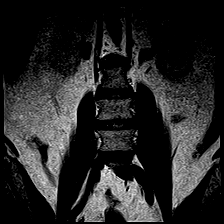
[im 17/20]
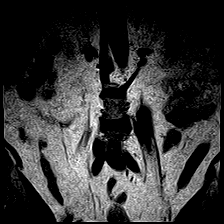
[im 20/20]
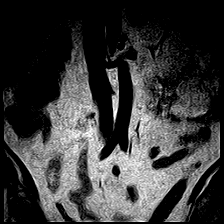

[48 of 48 positions shown; findings below may reference images not displayed]

FINDINGS: There are mild degenerative changes at multiple levels with disc desiccation and small osteophyte formation. 

There is mild disc bulging at L4-5 and L5-S1 with minimal compression of the thecal sac but no apparent nerve root compression.  No frank disc herniation is seen. 

There is no fracture, malalignment, significant marrow signal alteration, spinal stenosis, or abnormality of the conus.
IMPRESSION: 1. Mild degenerative disc disease. 

2. Mild L4-5 and L5-S1 disc bulging. 

3. No fracture, disc herniation, or spinal stenosis is seen.

## 2021-07-02 IMAGING — MR MRI THORACIC SPINE WITHOUT CONTRAST
4 of 5 series · 19 of 48 positions shown · non-contrast
Comparison: No prior exams are available at this time for comparison.

﻿EXAM:  [id number]   MRI THORACIC SPINE WITHOUT CONTRAST
INDICATION: Lifting injury.  Pain between the shoulders radiating down the right flank.
TECHNIQUE: Noncontrast multiplanar, multisequence MRI was performed.  The body coil was required due to the patient’s large size, and therefore, image quality is somewhat limited.

[Series 5: T2 · sagittal · 5.0mm · 0.78mm/px · 9 of 11 slices shown (1 of 2)]
[im 1/11]
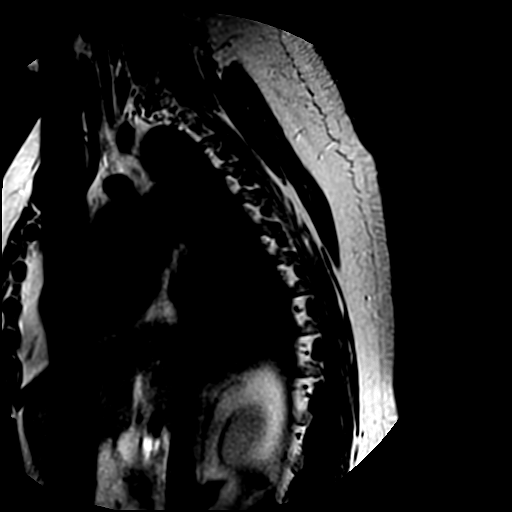
[im 2/11]
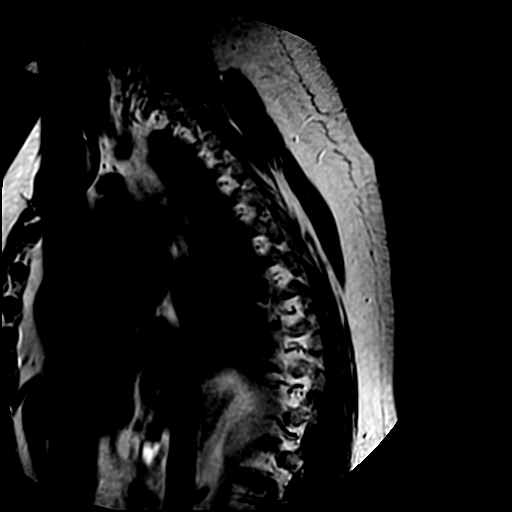
[im 3/11]
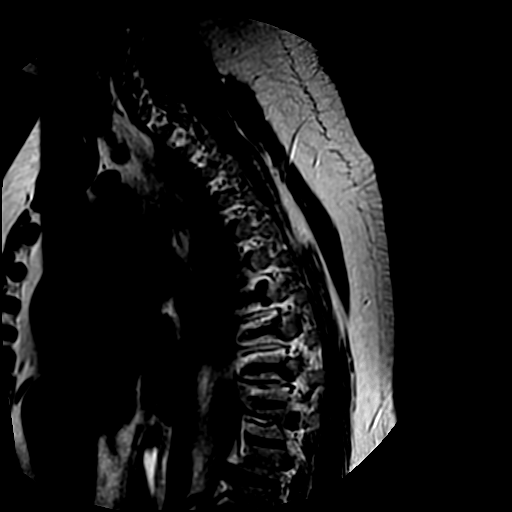
[im 4/11]
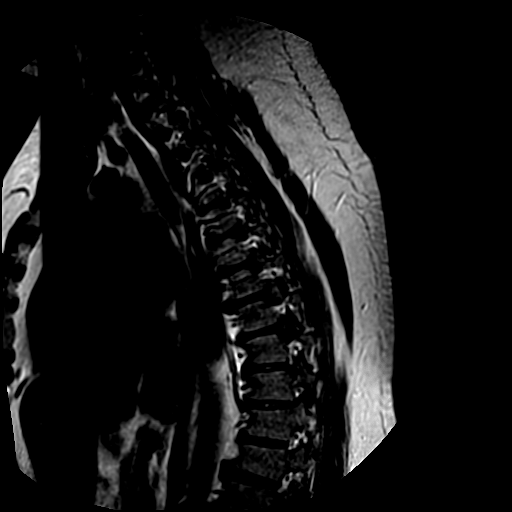
[im 6/11]
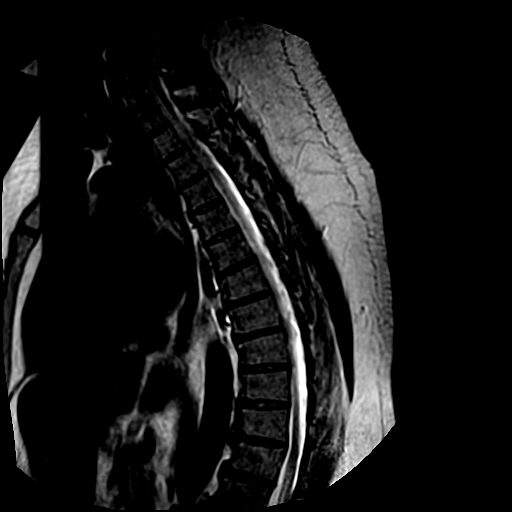
[im 7/11]
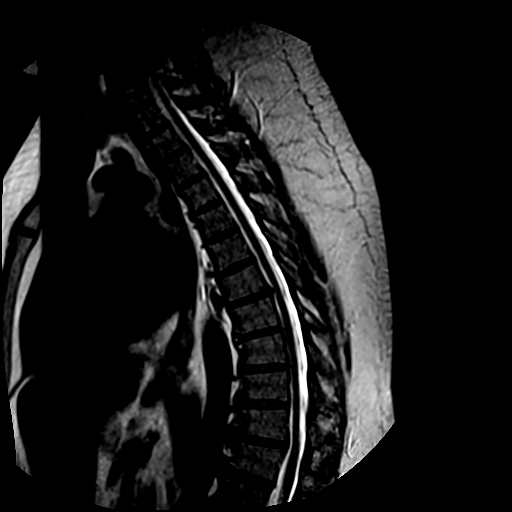
[im 8/11]
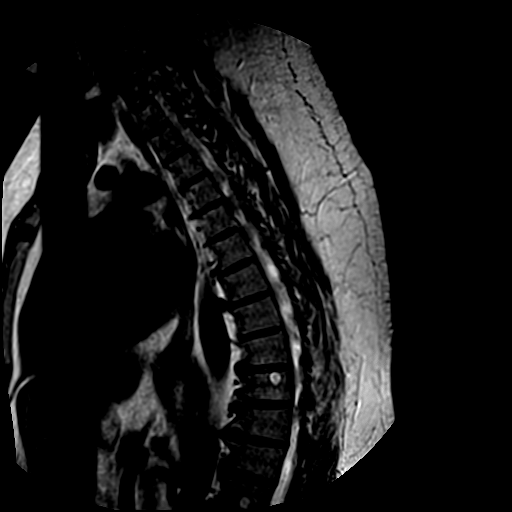
[im 9/11]
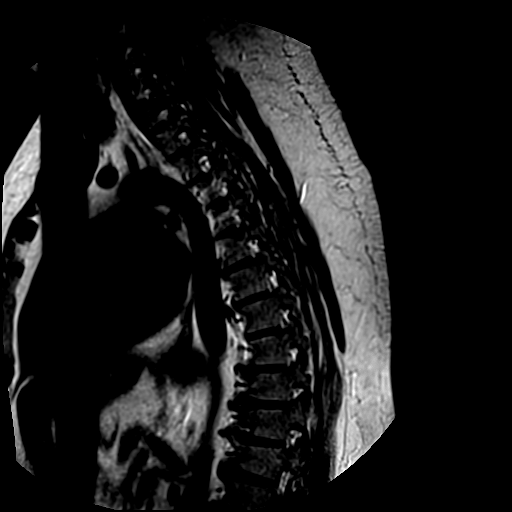
[im 11/11]
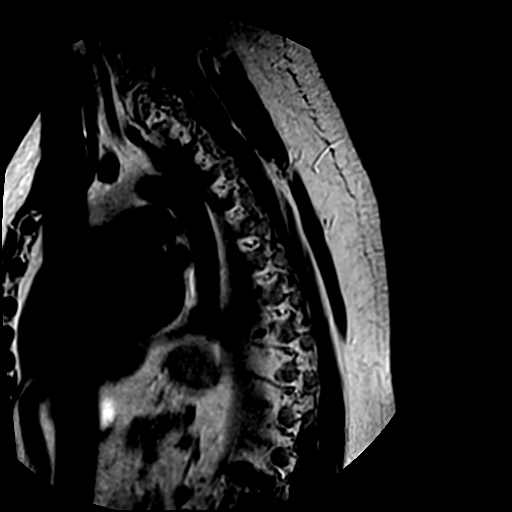

[Series 6: T1 · sagittal · 5.0mm · 0.78mm/px · 3 of 11 slices shown (1 of 2)]
[im 2/11]
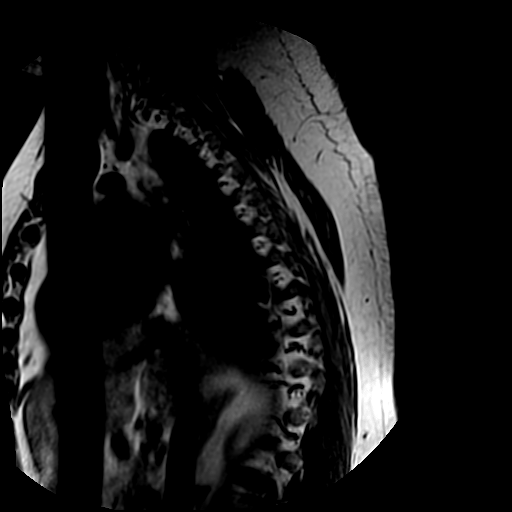
[im 6/11]
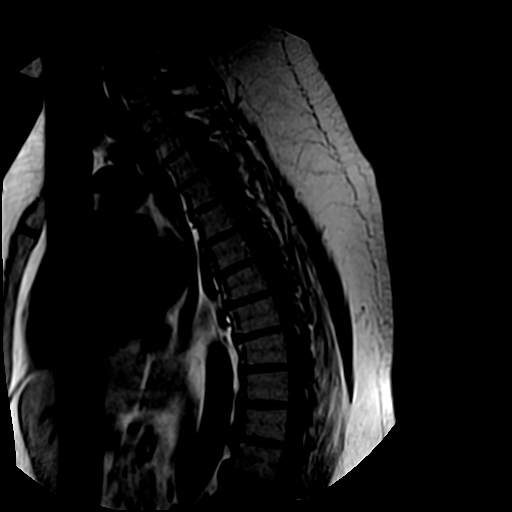
[im 9/11]
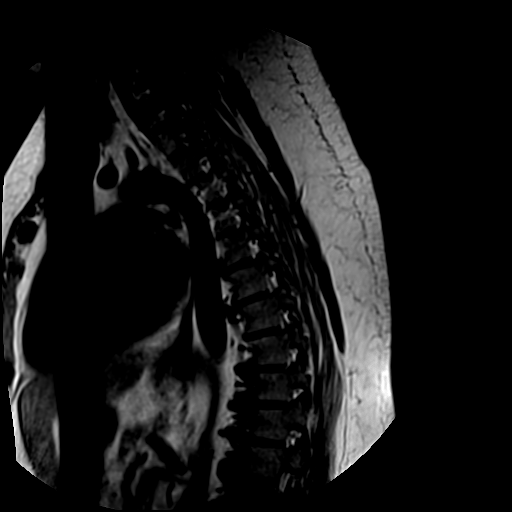

[Series 8: T1 · axial · 5.0mm · 0.45mm/px · z∈[-235,-102]mm · 3 of 12 slices shown (2 of 2)]
[im 2/12]
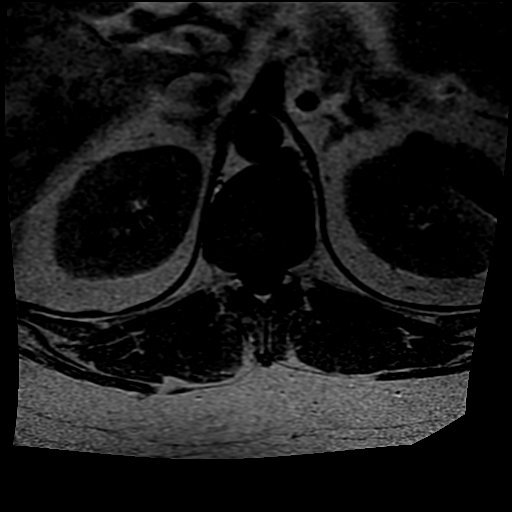
[im 7/12]
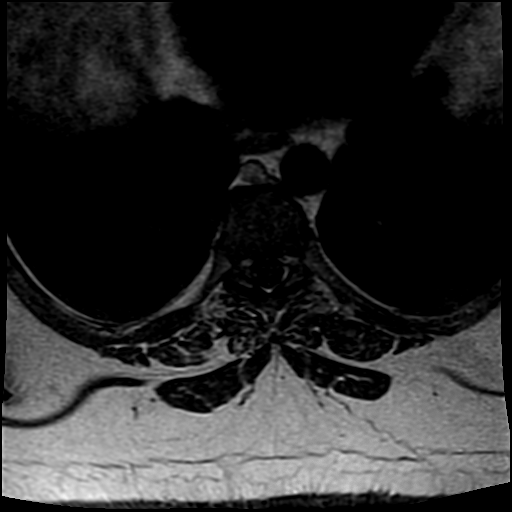
[im 10/12]
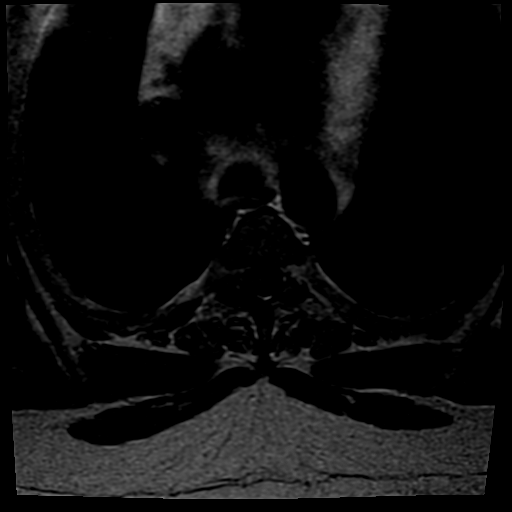

[Series 9: T2 · axial · 5.0mm · 0.45mm/px · z∈[-259,-102]mm · 4 of 14 slices shown (2 of 2)]
[im 1/14]
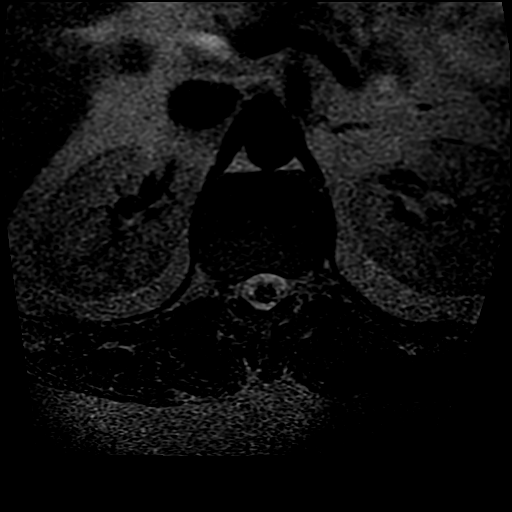
[im 2/14]
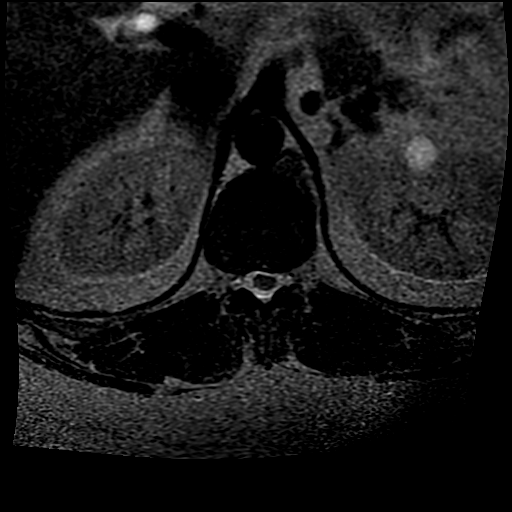
[im 7/14]
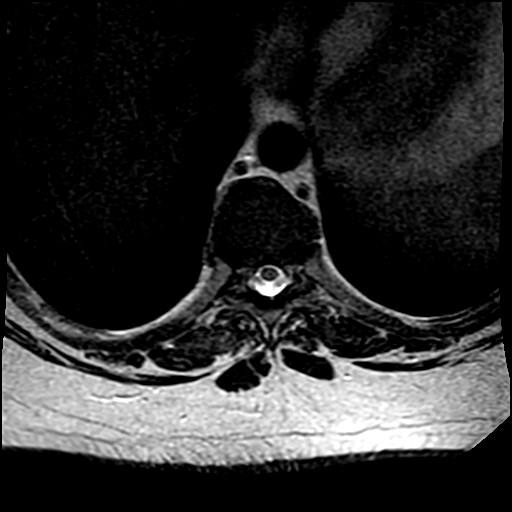
[im 12/14]
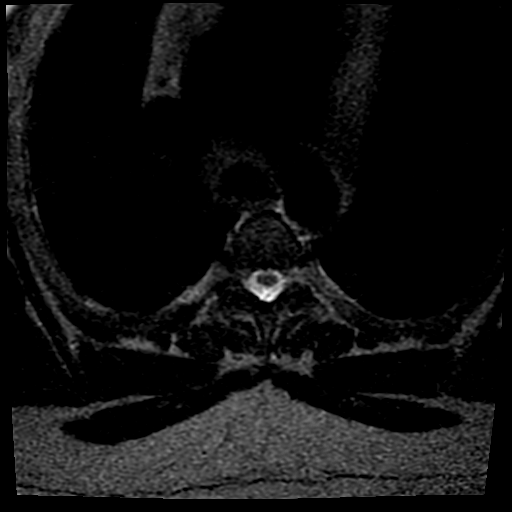

[19 of 48 positions shown; findings below may reference images not displayed]

FINDINGS: Mild-to-moderate degenerative changes are noted at multiple levels with disc space narrowing, disc desiccation, and small osteophyte formation.  

There is mild disc bulging with slight compression of the thecal sac in the mid thoracic spine, probably T5-T6.  

There is minimal disc bulging at T9-T10.

No frank disc herniation is seen.  There is no fracture, malalignment, spinal stenosis, or abnormality of the spinal cord.  There is no significant marrow signal alteration.
IMPRESSION: 1. Mild to moderate degenerative changes with insignificant disc bulging. 

2. No fracture or disc herniation is seen.

## 2023-01-26 IMAGING — DX XRAY LUMBAR SPINE [DATE] VIEWS
1 series · 3 of 3 positions shown · non-contrast
Comparison: none

﻿EXAM:  [DATE]      XRAY KNEE 4 OR MORE VIEWS LT,XRAY LUMBAR SPINE [DATE] VIEWS
INDICATION: Pain that has lasted on and off for a year or more. No surgery to either area.  

LEFT KNEE:

[Series 1: obl · 0.14mm/px · 3 of 3 slices shown]
[im 1/3]
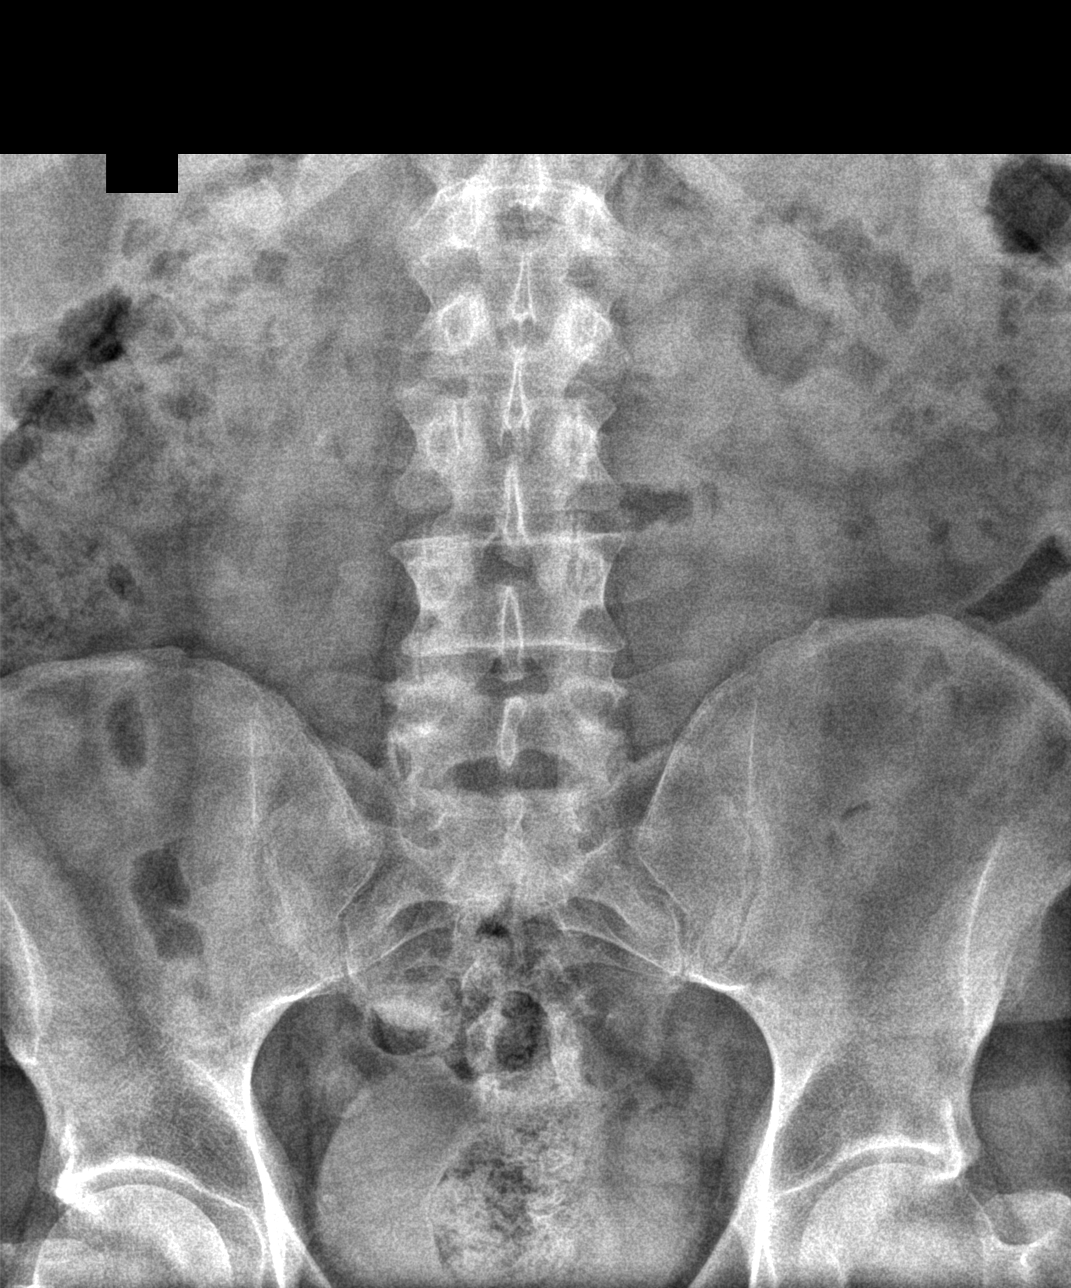
[im 2/3]
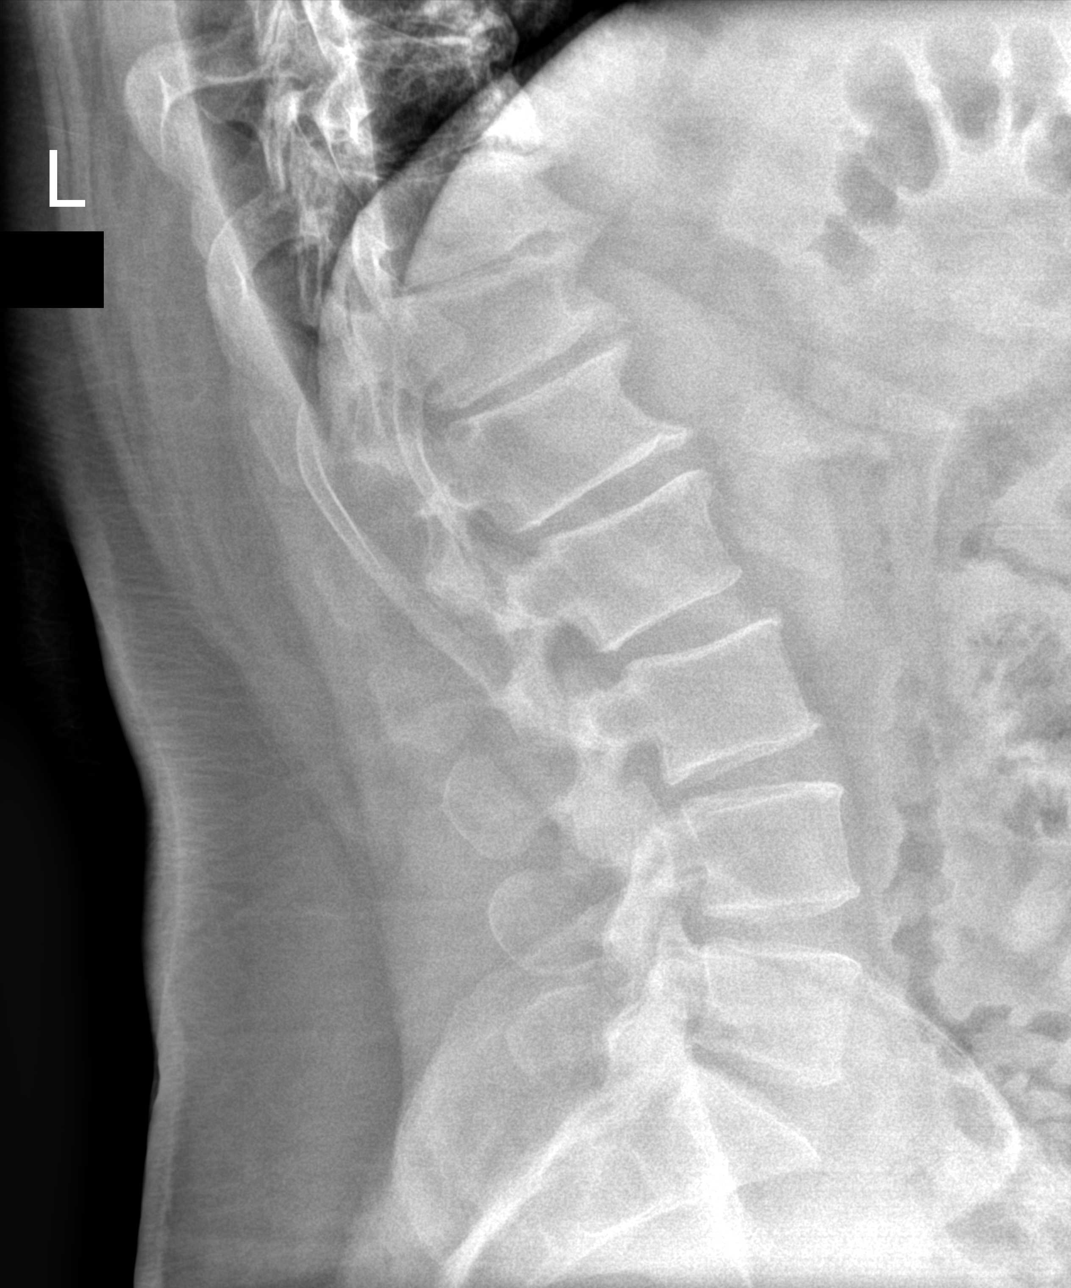
[im 3/3]
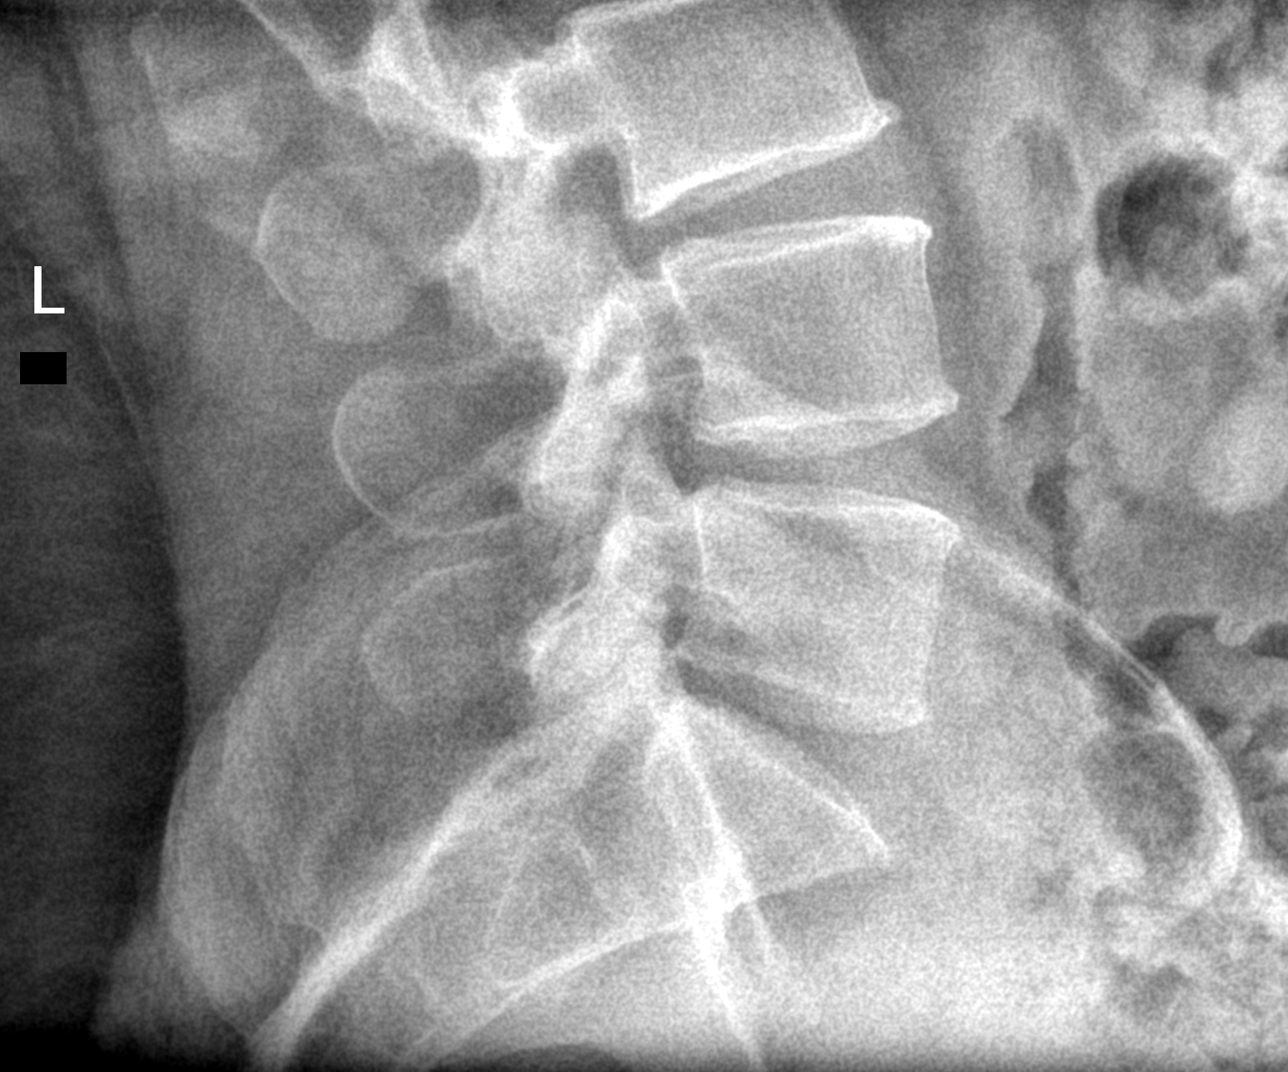

[3 of 3 positions shown; findings below may reference images not displayed]

FINDINGS: Four views demonstrate mild degenerative change involving the patellofemoral joint.  There is no fracture, dislocation, or joint effusion. Two small bone islands are noted incidentally.
IMPRESSION: Mild patellofemoral osteoarthritis.  

LUMBOSACRAL SPINE:
FINDINGS: There are moderate degenerative and hypertrophic changes in the lower thoracic and upper lumbar spine with disc space narrowing and osteophyte and syndesmophyte formation.  There are also moderate degenerative changes involving the posterior elements in the lower lumbar spine.  

There is no fracture or malalignment.
IMPRESSION: Moderate degenerative changes.

## 2023-01-26 IMAGING — DX XRAY KNEE 4 OR MORE VIEWS LT
1 series · 4 of 4 positions shown · non-contrast
Comparison: none

﻿EXAM:  [DATE]      XRAY KNEE 4 OR MORE VIEWS LT,XRAY LUMBAR SPINE [DATE] VIEWS
INDICATION: Pain that has lasted on and off for a year or more. No surgery to either area.  

LEFT KNEE:

[Series 1: AP · 0.14mm/px · 4 of 4 slices shown]
[im 1/4]
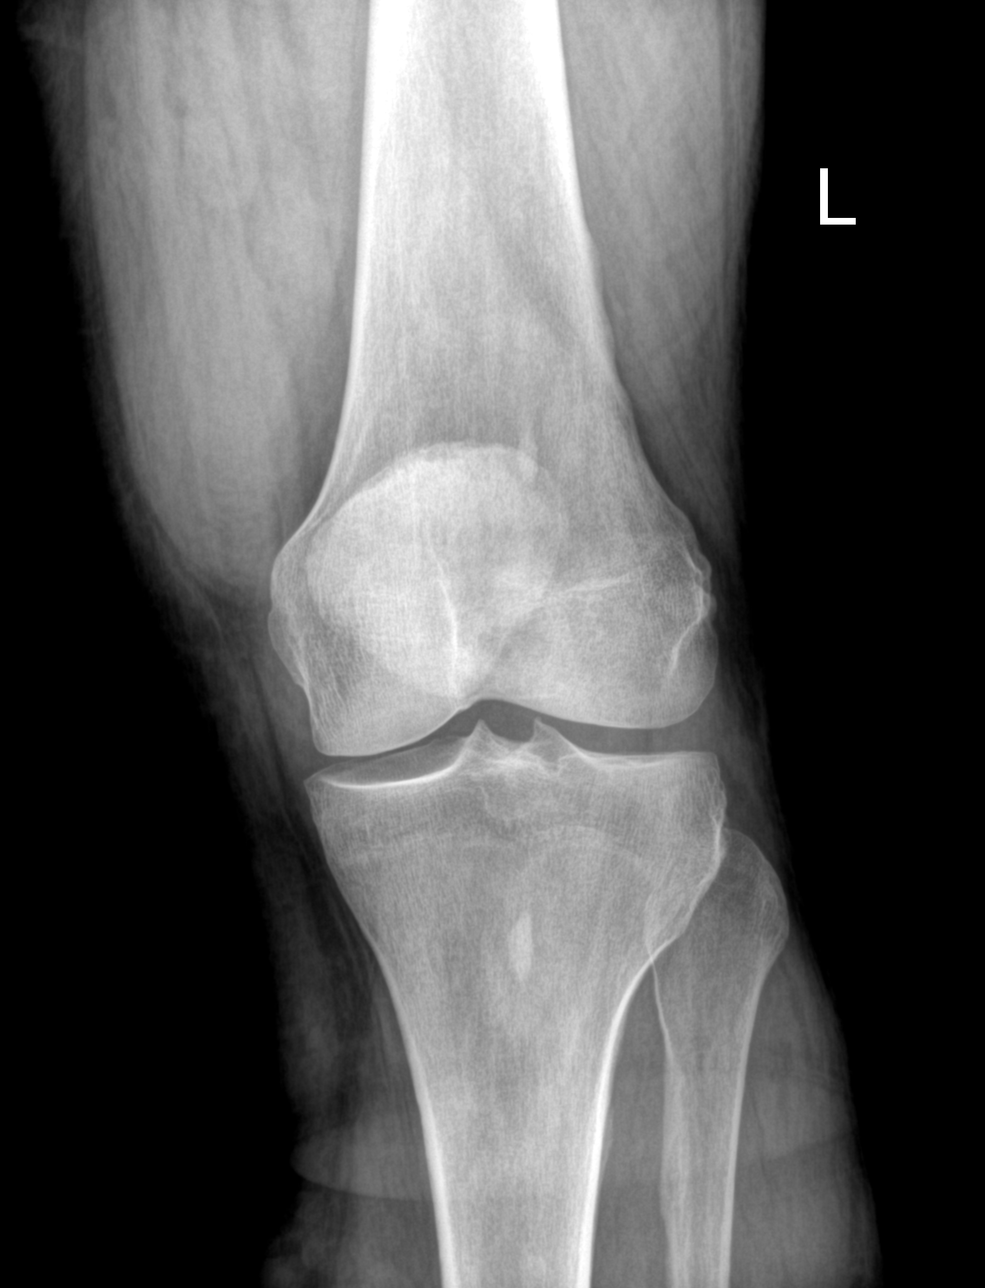
[im 2/4]
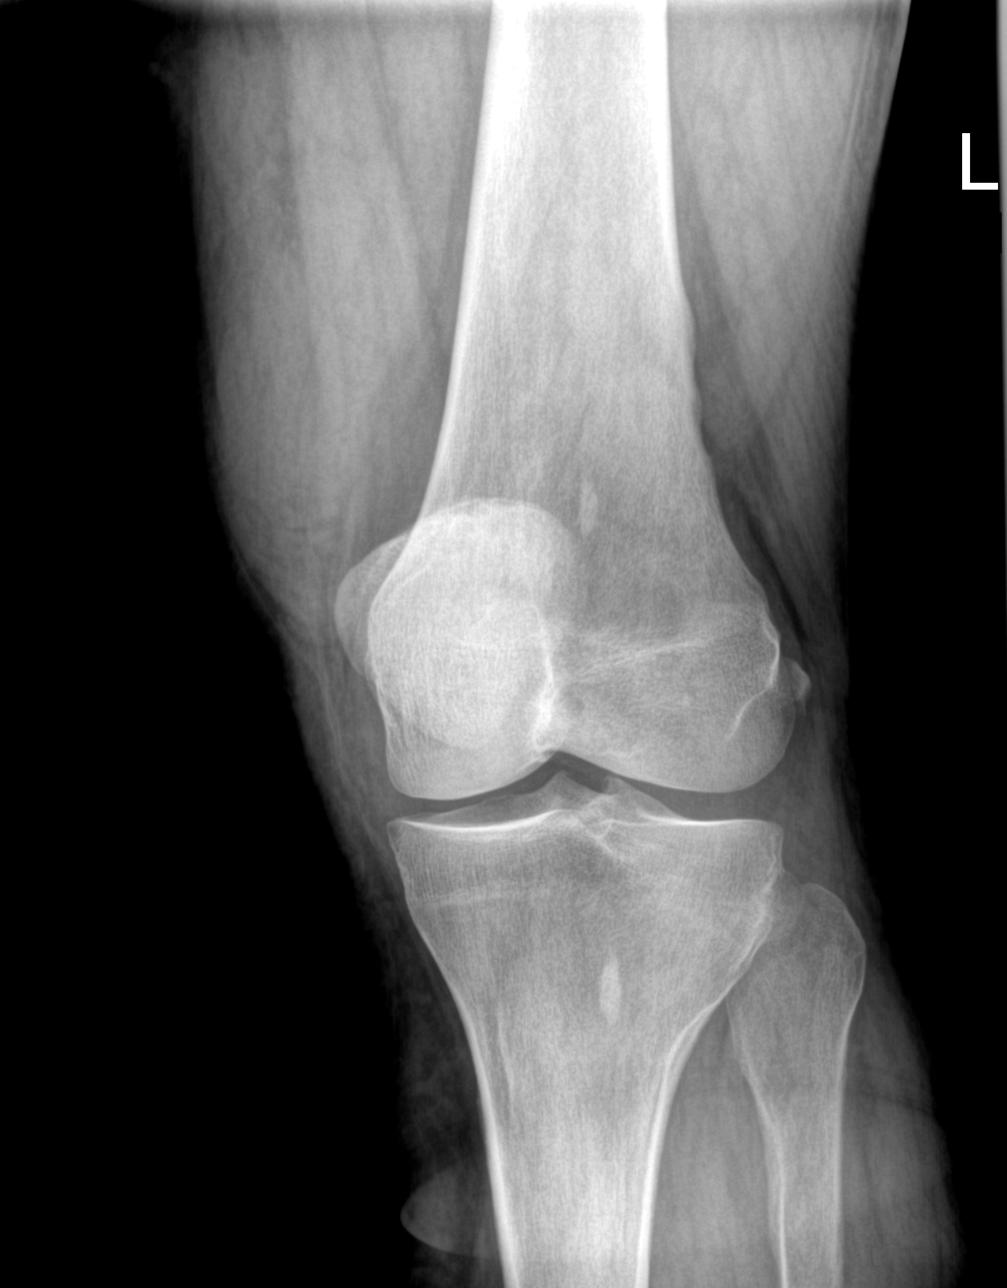
[im 3/4]
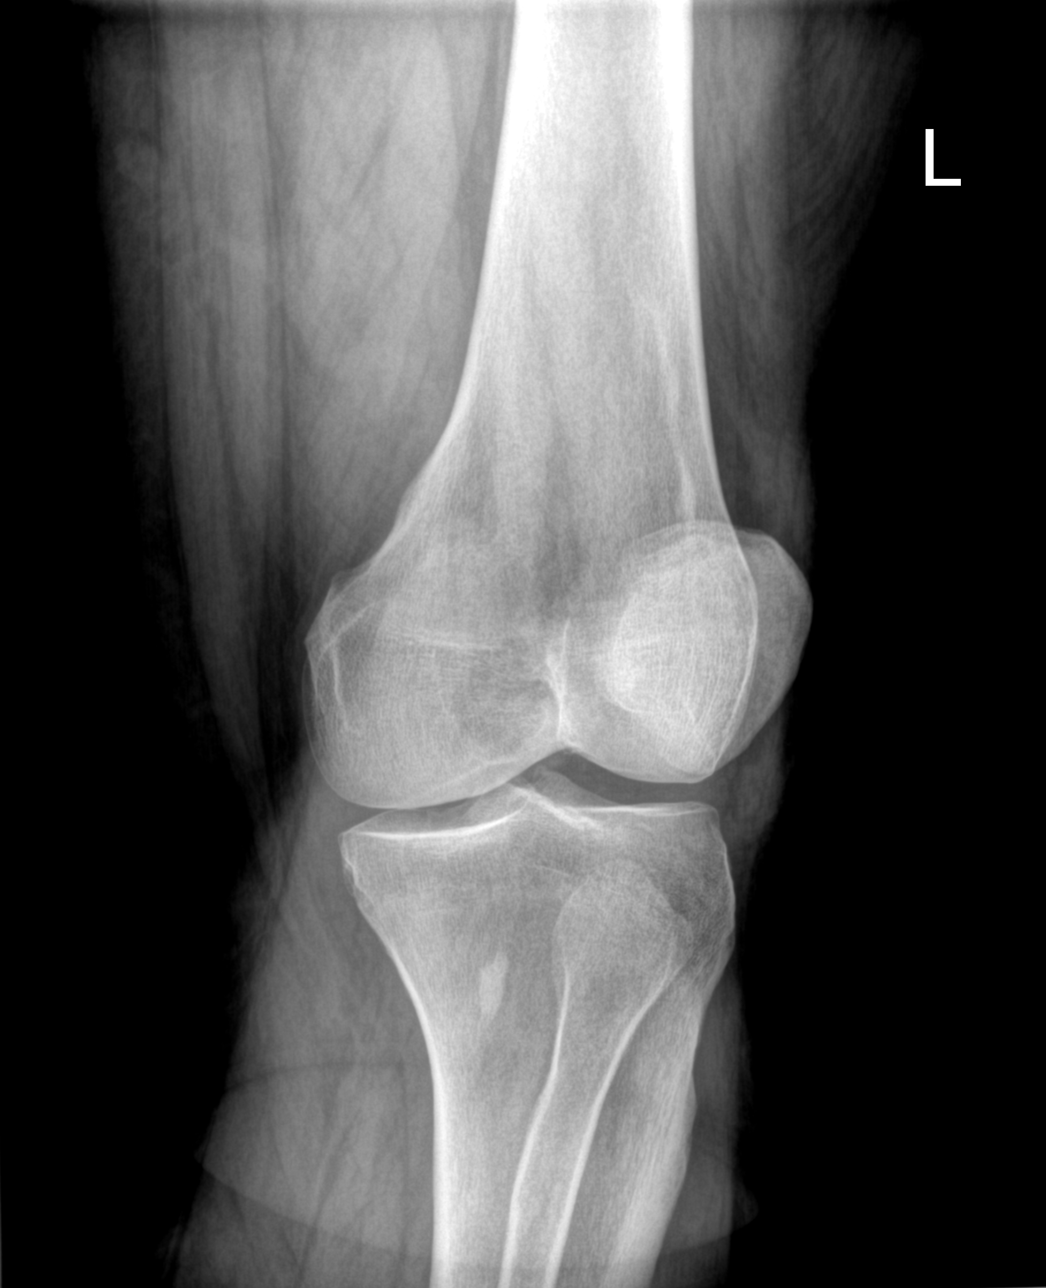
[im 4/4]
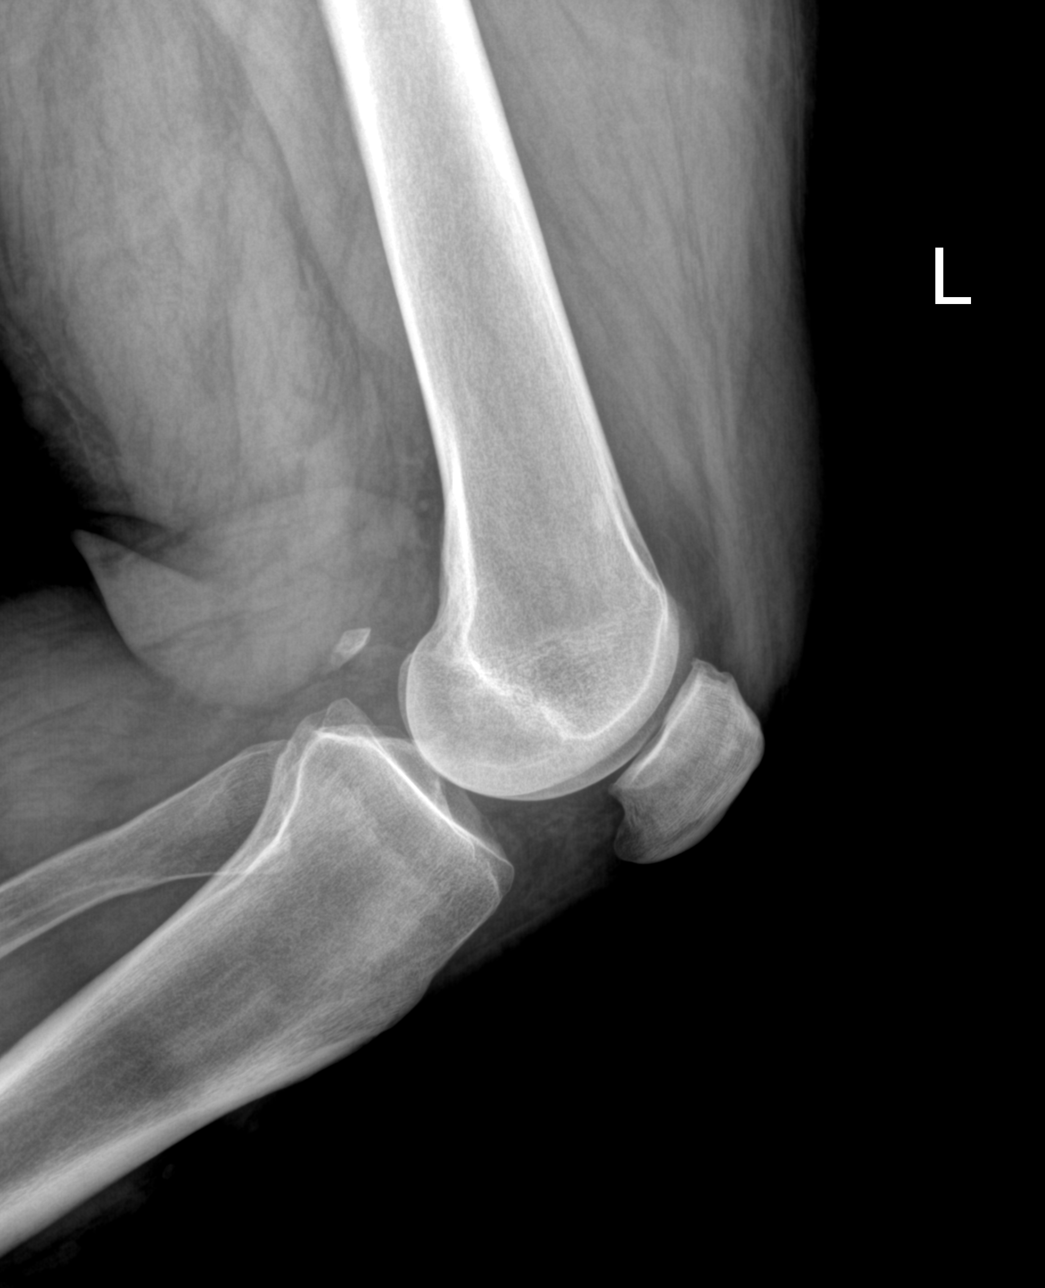

[4 of 4 positions shown; findings below may reference images not displayed]

FINDINGS: Four views demonstrate mild degenerative change involving the patellofemoral joint.  There is no fracture, dislocation, or joint effusion. Two small bone islands are noted incidentally.
IMPRESSION: Mild patellofemoral osteoarthritis.  

LUMBOSACRAL SPINE:
FINDINGS: There are moderate degenerative and hypertrophic changes in the lower thoracic and upper lumbar spine with disc space narrowing and osteophyte and syndesmophyte formation.  There are also moderate degenerative changes involving the posterior elements in the lower lumbar spine.  

There is no fracture or malalignment.
IMPRESSION: Moderate degenerative changes.

## 2023-03-03 IMAGING — MR MRI LUMBAR SPINE WITHOUT CONTRAST
6 series · 48 of 48 positions shown · IV contrast (gadolinium)
Comparison: Lumbar spine x-ray dated 01/26/2023. Previous MRI dated 07/02/2021.

﻿EXAM:  [id number]   MRI LUMBAR SPINE WITHOUT CONTRAST
INDICATION: 47-year-old sustained lifting injury about a year ago. Persistent back pain and bilateral hip pain.  No history of malignancy or back surgery.
TECHNIQUE: Multiplanar, multisequential MRI of the lumbosacral spine was performed without gadolinium contrast.

[Series 4: T2 · sagittal · 5.5mm · 1.00mm/px · 6 of 13 slices shown (1 of 3)]
[im 1/13]
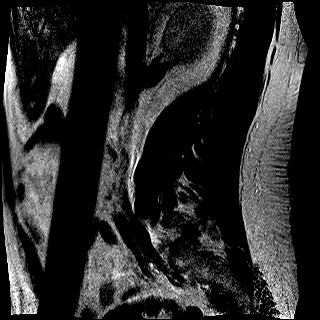
[im 3/13]
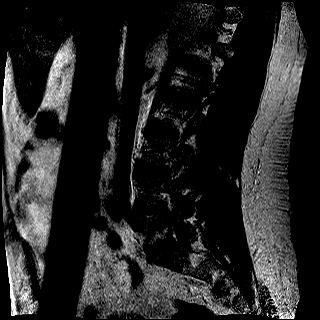
[im 5/13]
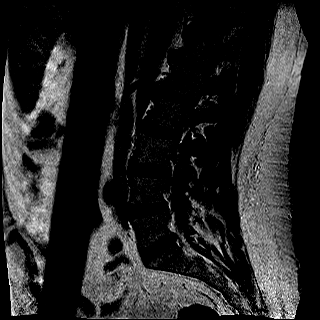
[im 8/13]
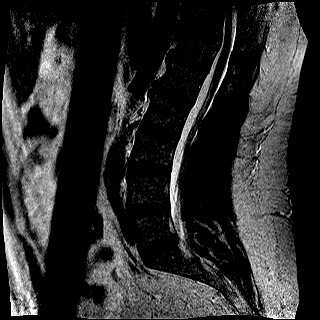
[im 10/13]
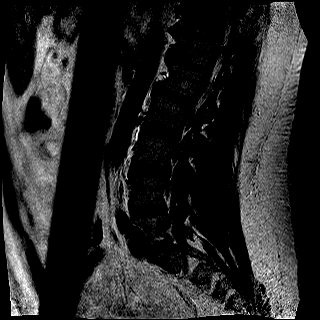
[im 13/13]
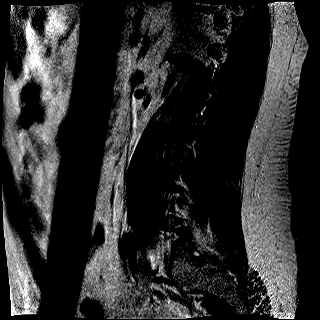

[Series 5: T1 · sagittal · 5.5mm · 1.00mm/px · 6 of 13 slices shown (1 of 2)]
[im 1/13]
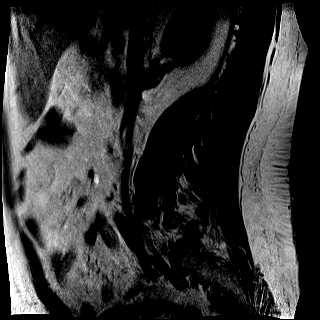
[im 3/13]
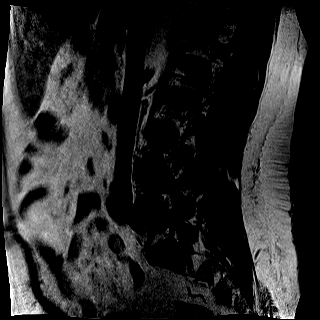
[im 5/13]
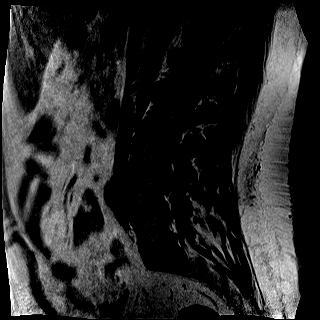
[im 8/13]
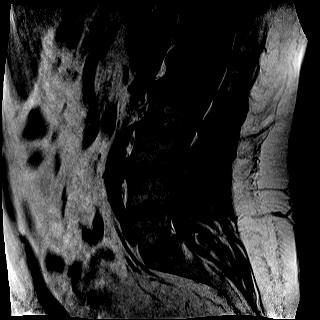
[im 10/13]
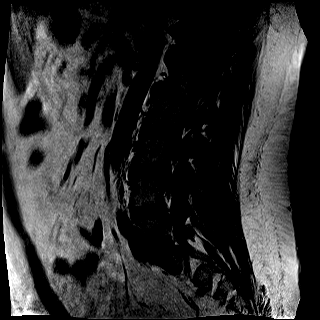
[im 13/13]
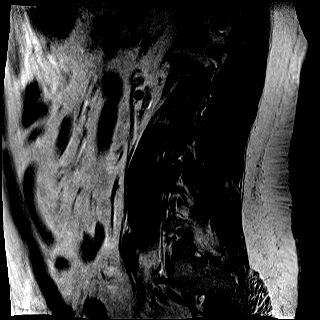

[Series 6: STIR · sagittal · 5.5mm · 1.25mm/px · 6 of 13 slices shown]
[im 1/13]
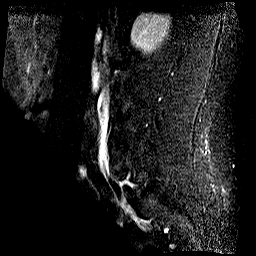
[im 3/13]
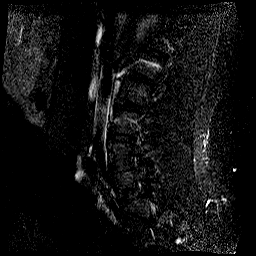
[im 5/13]
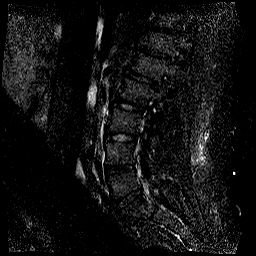
[im 8/13]
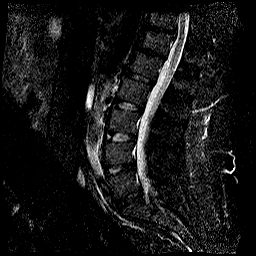
[im 10/13]
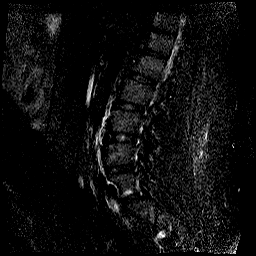
[im 13/13]
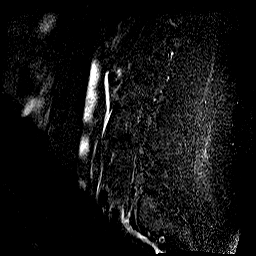

[Series 7: T2 · coronal · 4.0mm · 1.88mm/px · 10 of 20 slices shown (2 of 3)]
[im 1/20]
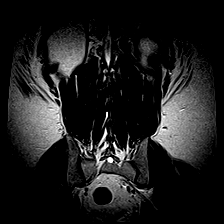
[im 3/20]
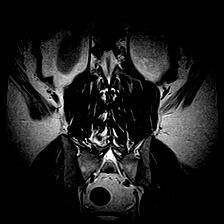
[im 5/20]
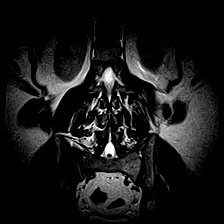
[im 7/20]
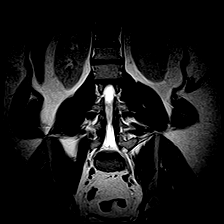
[im 9/20]
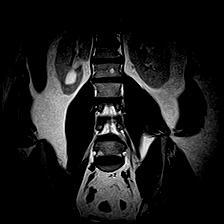
[im 11/20]
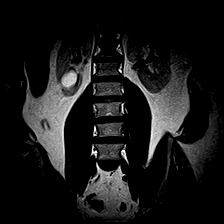
[im 13/20]
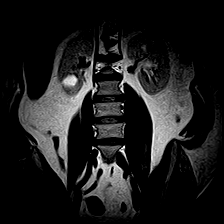
[im 15/20]
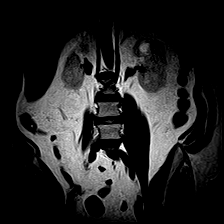
[im 17/20]
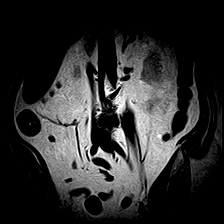
[im 20/20]
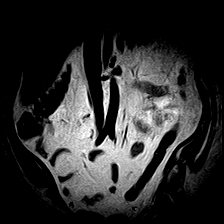

[Series 8: T2 · axial · 5.0mm · 0.89mm/px · z∈[-137,+151]mm · 10 of 20 slices shown (3 of 3)]
[im 1/20]
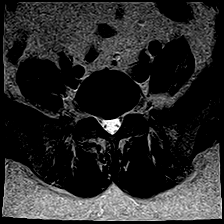
[im 3/20]
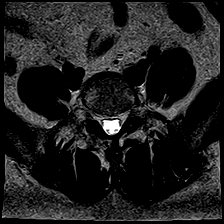
[im 5/20]
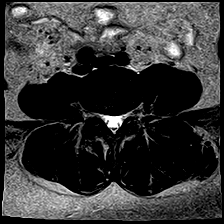
[im 7/20]
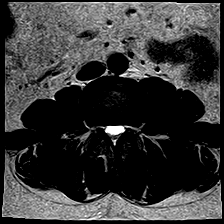
[im 9/20]
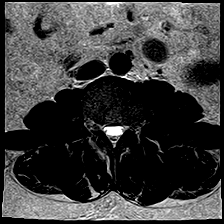
[im 11/20]
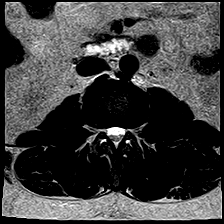
[im 13/20]
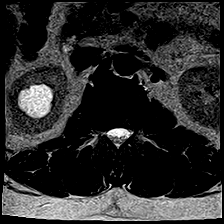
[im 15/20]
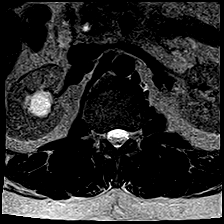
[im 17/20]
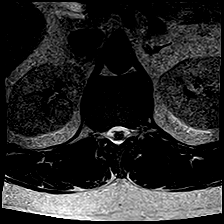
[im 20/20]
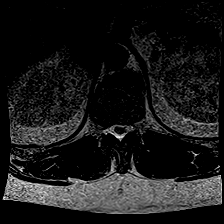

[Series 9: T1 · axial · 5.0mm · 0.89mm/px · z∈[-137,+151]mm · 10 of 20 slices shown (2 of 2)]
[im 1/20]
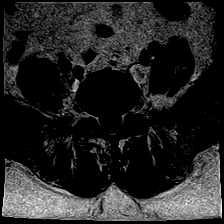
[im 3/20]
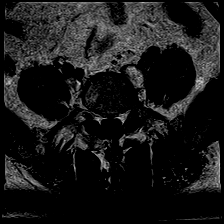
[im 5/20]
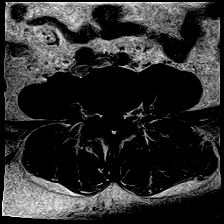
[im 7/20]
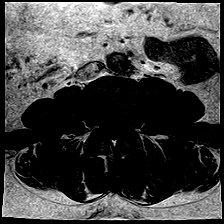
[im 9/20]
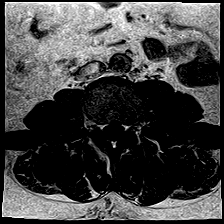
[im 11/20]
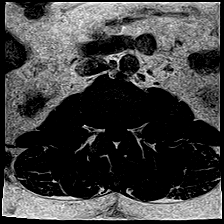
[im 13/20]
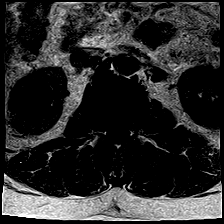
[im 15/20]
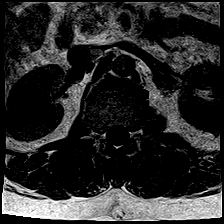
[im 17/20]
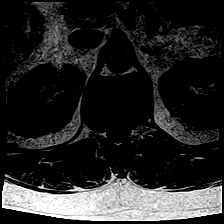
[im 20/20]
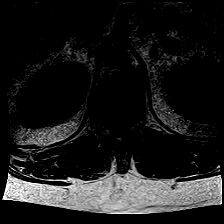

[48 of 48 positions shown; findings below may reference images not displayed]

FINDINGS: Overall quality is less than optimal due to the body habitus. Quality is acceptable for interpretation.

No acute bony lesions of lumbar vertebrae are seen.  Conus terminates at L1-2 level. 

L1-2 disc shows no focal lesions.  Mild degenerative disc disease and facet arthropathy are causing minimal compromise of both lateral recess.

At L2-3 level, degenerative disc disease and facet arthropathy are causing minimal compromise of both lateral recess.  Similar finding is noted at L3-4 level. 

At L4-5 level, degenerative disc disease with focal bulging annulus in the midline minimally impinging on thecal sac.  Bulging annulus and facet arthropathy are causing mild compromise of both lateral recess and neural foramina. 

At L5-S1 level, degenerative disc disease with bulging annulus mildly impinging on thecal sac in the midline.  Mild compromise of both neural foramina are noted due to degenerative disc disease and facet arthropathy.

Bilateral renal cysts are noted.
IMPRESSION: 1. No focal bone changes of lumbar vertebrae are seen.  

2. Mild degenerative disc disease and facet arthropathy at multiple levels as described above in detail at each level.  Findings are essentially unchanged from previous examination of 07/02/2021.
